# Patient Record
Sex: Female | Born: 1965 | Race: White | Hispanic: Yes | Marital: Married | Smoking: Former smoker
Health system: Southern US, Community
[De-identification: ages and names within clinical notes are randomized; demographics above are authoritative.]

## PROBLEM LIST (undated history)

## (undated) DIAGNOSIS — D649 Anemia, unspecified: Secondary | ICD-10-CM

## (undated) DIAGNOSIS — Z973 Presence of spectacles and contact lenses: Secondary | ICD-10-CM

## (undated) DIAGNOSIS — F419 Anxiety disorder, unspecified: Secondary | ICD-10-CM

## (undated) DIAGNOSIS — Z90710 Acquired absence of both cervix and uterus: Secondary | ICD-10-CM

## (undated) DIAGNOSIS — Z8041 Family history of malignant neoplasm of ovary: Secondary | ICD-10-CM

## (undated) DIAGNOSIS — Z90722 Acquired absence of ovaries, bilateral: Secondary | ICD-10-CM

## (undated) DIAGNOSIS — E039 Hypothyroidism, unspecified: Secondary | ICD-10-CM

## (undated) DIAGNOSIS — N857 Hematometra: Secondary | ICD-10-CM

## (undated) DIAGNOSIS — Z9889 Other specified postprocedural states: Secondary | ICD-10-CM

## (undated) DIAGNOSIS — R102 Pelvic and perineal pain: Secondary | ICD-10-CM

## (undated) HISTORY — DX: Anxiety disorder, unspecified: F41.9

## (undated) HISTORY — DX: Anemia, unspecified: D64.9

## (undated) HISTORY — DX: Hypothyroidism, unspecified: E03.9

## (undated) HISTORY — PX: CERVICAL SPINE SURGERY: SHX589

## (undated) HISTORY — PX: TOTAL VAGINAL HYSTERECTOMY: SHX2548

## (undated) HISTORY — PX: THYROIDECTOMY: SHX17

---

## 1989-10-22 HISTORY — PX: TUBAL LIGATION: SHX77

## 2003-03-08 ENCOUNTER — Ambulatory Visit (HOSPITAL_COMMUNITY): Admission: RE | Admit: 2003-03-08 | Discharge: 2003-03-08 | Payer: Self-pay | Admitting: Orthopaedic Surgery

## 2003-06-25 ENCOUNTER — Ambulatory Visit (HOSPITAL_COMMUNITY): Admission: RE | Admit: 2003-06-25 | Discharge: 2003-06-26 | Payer: Self-pay | Admitting: Orthopaedic Surgery

## 2004-09-25 ENCOUNTER — Encounter: Admission: RE | Admit: 2004-09-25 | Discharge: 2004-09-25 | Payer: Self-pay | Admitting: Internal Medicine

## 2004-10-04 ENCOUNTER — Encounter: Admission: RE | Admit: 2004-10-04 | Discharge: 2004-10-04 | Payer: Self-pay | Admitting: Internal Medicine

## 2004-11-10 ENCOUNTER — Emergency Department (HOSPITAL_COMMUNITY): Admission: EM | Admit: 2004-11-10 | Discharge: 2004-11-10 | Payer: Self-pay | Admitting: Emergency Medicine

## 2005-11-15 ENCOUNTER — Encounter: Admission: RE | Admit: 2005-11-15 | Discharge: 2005-11-15 | Payer: Self-pay | Admitting: Internal Medicine

## 2005-11-22 ENCOUNTER — Encounter: Admission: RE | Admit: 2005-11-22 | Discharge: 2005-11-22 | Payer: Self-pay | Admitting: Internal Medicine

## 2005-11-22 ENCOUNTER — Ambulatory Visit: Admission: RE | Admit: 2005-11-22 | Discharge: 2005-11-22 | Payer: Self-pay | Admitting: Otolaryngology

## 2006-11-19 ENCOUNTER — Encounter: Admission: RE | Admit: 2006-11-19 | Discharge: 2006-11-19 | Payer: Self-pay | Admitting: Internal Medicine

## 2006-12-03 ENCOUNTER — Encounter: Admission: RE | Admit: 2006-12-03 | Discharge: 2006-12-03 | Payer: Self-pay | Admitting: Internal Medicine

## 2007-09-05 ENCOUNTER — Ambulatory Visit (HOSPITAL_COMMUNITY): Admission: RE | Admit: 2007-09-05 | Discharge: 2007-09-05 | Payer: Self-pay | Admitting: Internal Medicine

## 2007-10-23 HISTORY — PX: BACK SURGERY: SHX140

## 2007-11-14 ENCOUNTER — Ambulatory Visit: Payer: Self-pay | Admitting: Internal Medicine

## 2007-11-24 ENCOUNTER — Encounter: Admission: RE | Admit: 2007-11-24 | Discharge: 2007-11-24 | Payer: Self-pay | Admitting: Internal Medicine

## 2008-05-14 ENCOUNTER — Ambulatory Visit (HOSPITAL_COMMUNITY): Admission: RE | Admit: 2008-05-14 | Discharge: 2008-05-15 | Payer: Self-pay | Admitting: Orthopaedic Surgery

## 2008-11-24 ENCOUNTER — Encounter: Admission: RE | Admit: 2008-11-24 | Discharge: 2008-11-24 | Payer: Self-pay | Admitting: Internal Medicine

## 2009-10-04 ENCOUNTER — Ambulatory Visit: Payer: Self-pay | Admitting: Internal Medicine

## 2009-12-19 ENCOUNTER — Encounter: Admission: RE | Admit: 2009-12-19 | Discharge: 2009-12-19 | Payer: Self-pay | Admitting: Internal Medicine

## 2010-01-06 ENCOUNTER — Encounter (INDEPENDENT_AMBULATORY_CARE_PROVIDER_SITE_OTHER): Payer: Self-pay | Admitting: Surgery

## 2010-01-06 ENCOUNTER — Ambulatory Visit (HOSPITAL_COMMUNITY): Admission: RE | Admit: 2010-01-06 | Discharge: 2010-01-07 | Payer: Self-pay | Admitting: Surgery

## 2010-11-13 ENCOUNTER — Other Ambulatory Visit: Payer: Self-pay | Admitting: *Deleted

## 2010-11-13 DIAGNOSIS — Z1239 Encounter for other screening for malignant neoplasm of breast: Secondary | ICD-10-CM

## 2010-12-22 ENCOUNTER — Ambulatory Visit: Payer: Self-pay

## 2010-12-26 ENCOUNTER — Ambulatory Visit: Payer: Self-pay

## 2011-01-04 ENCOUNTER — Ambulatory Visit: Payer: Self-pay

## 2011-01-15 LAB — BASIC METABOLIC PANEL
BUN: 9 mg/dL (ref 6–23)
CO2: 27 mEq/L (ref 19–32)
Calcium: 8.9 mg/dL (ref 8.4–10.5)
Chloride: 107 mEq/L (ref 96–112)
Creatinine, Ser: 0.56 mg/dL (ref 0.4–1.2)
GFR calc Af Amer: >60 mL/min (ref >60)
GFR calc non Af Amer: >60 mL/min (ref >60)
Glucose, Bld: 97 mg/dL (ref 70–99)
Potassium: 3.9 mEq/L (ref 3.5–5.1)
Sodium: 137 mEq/L (ref 135–145)

## 2011-01-15 LAB — URINALYSIS, ROUTINE W REFLEX MICROSCOPIC
Bilirubin Urine: NEGATIVE
Glucose, UA: NEGATIVE mg/dL
Ketones, ur: NEGATIVE mg/dL
Nitrite: NEGATIVE
Protein, ur: NEGATIVE mg/dL
Specific Gravity, Urine: 1.016 (ref 1.005–1.030)
Urobilinogen, UA: 0.2 mg/dL (ref 0.0–1.0)
pH: 6.5 (ref 5.0–8.0)

## 2011-01-15 LAB — URINE MICROSCOPIC-ADD ON

## 2011-01-15 LAB — PROTIME-INR
INR: 0.97 (ref 0.00–1.49)
Prothrombin Time: 12.8 seconds (ref 11.6–15.2)

## 2011-01-15 LAB — DIFFERENTIAL
Basophils Absolute: 0 10*3/uL (ref 0.0–0.1)
Basophils Relative: 1 % (ref 0–1)
Eosinophils Absolute: 0.1 10*3/uL (ref 0.0–0.7)
Eosinophils Relative: 1 % (ref 0–5)
Lymphocytes Relative: 31 % (ref 12–46)
Lymphs Abs: 1.5 10*3/uL (ref 0.7–4.0)
Monocytes Absolute: 0.3 10*3/uL (ref 0.1–1.0)
Monocytes Relative: 7 % (ref 3–12)
Neutro Abs: 3 10*3/uL (ref 1.7–7.7)
Neutrophils Relative %: 61 % (ref 43–77)

## 2011-01-15 LAB — CALCIUM
Calcium: 8.5 mg/dL (ref 8.4–10.5)
Calcium: 8.7 mg/dL (ref 8.4–10.5)

## 2011-01-15 LAB — CBC
HCT: 40.2 % (ref 36.0–46.0)
Hemoglobin: 13.6 g/dL (ref 12.0–15.0)
MCHC: 33.8 g/dL (ref 30.0–36.0)
MCV: 87.7 fL (ref 78.0–100.0)
Platelets: 233 10*3/uL (ref 150–400)
RBC: 4.58 MIL/uL (ref 3.87–5.11)
RDW: 12.8 % (ref 11.5–15.5)
WBC: 4.9 10*3/uL (ref 4.0–10.5)

## 2011-01-15 LAB — PREGNANCY, URINE: Preg Test, Ur: NEGATIVE

## 2011-02-01 IMAGING — CR DG CHEST 2V
2 series · 2 of 2 positions shown · non-contrast
Comparison: None

CLINICAL DATA: Preop for thyroid surgery

CHEST - 2 VIEW

[w chest pa]
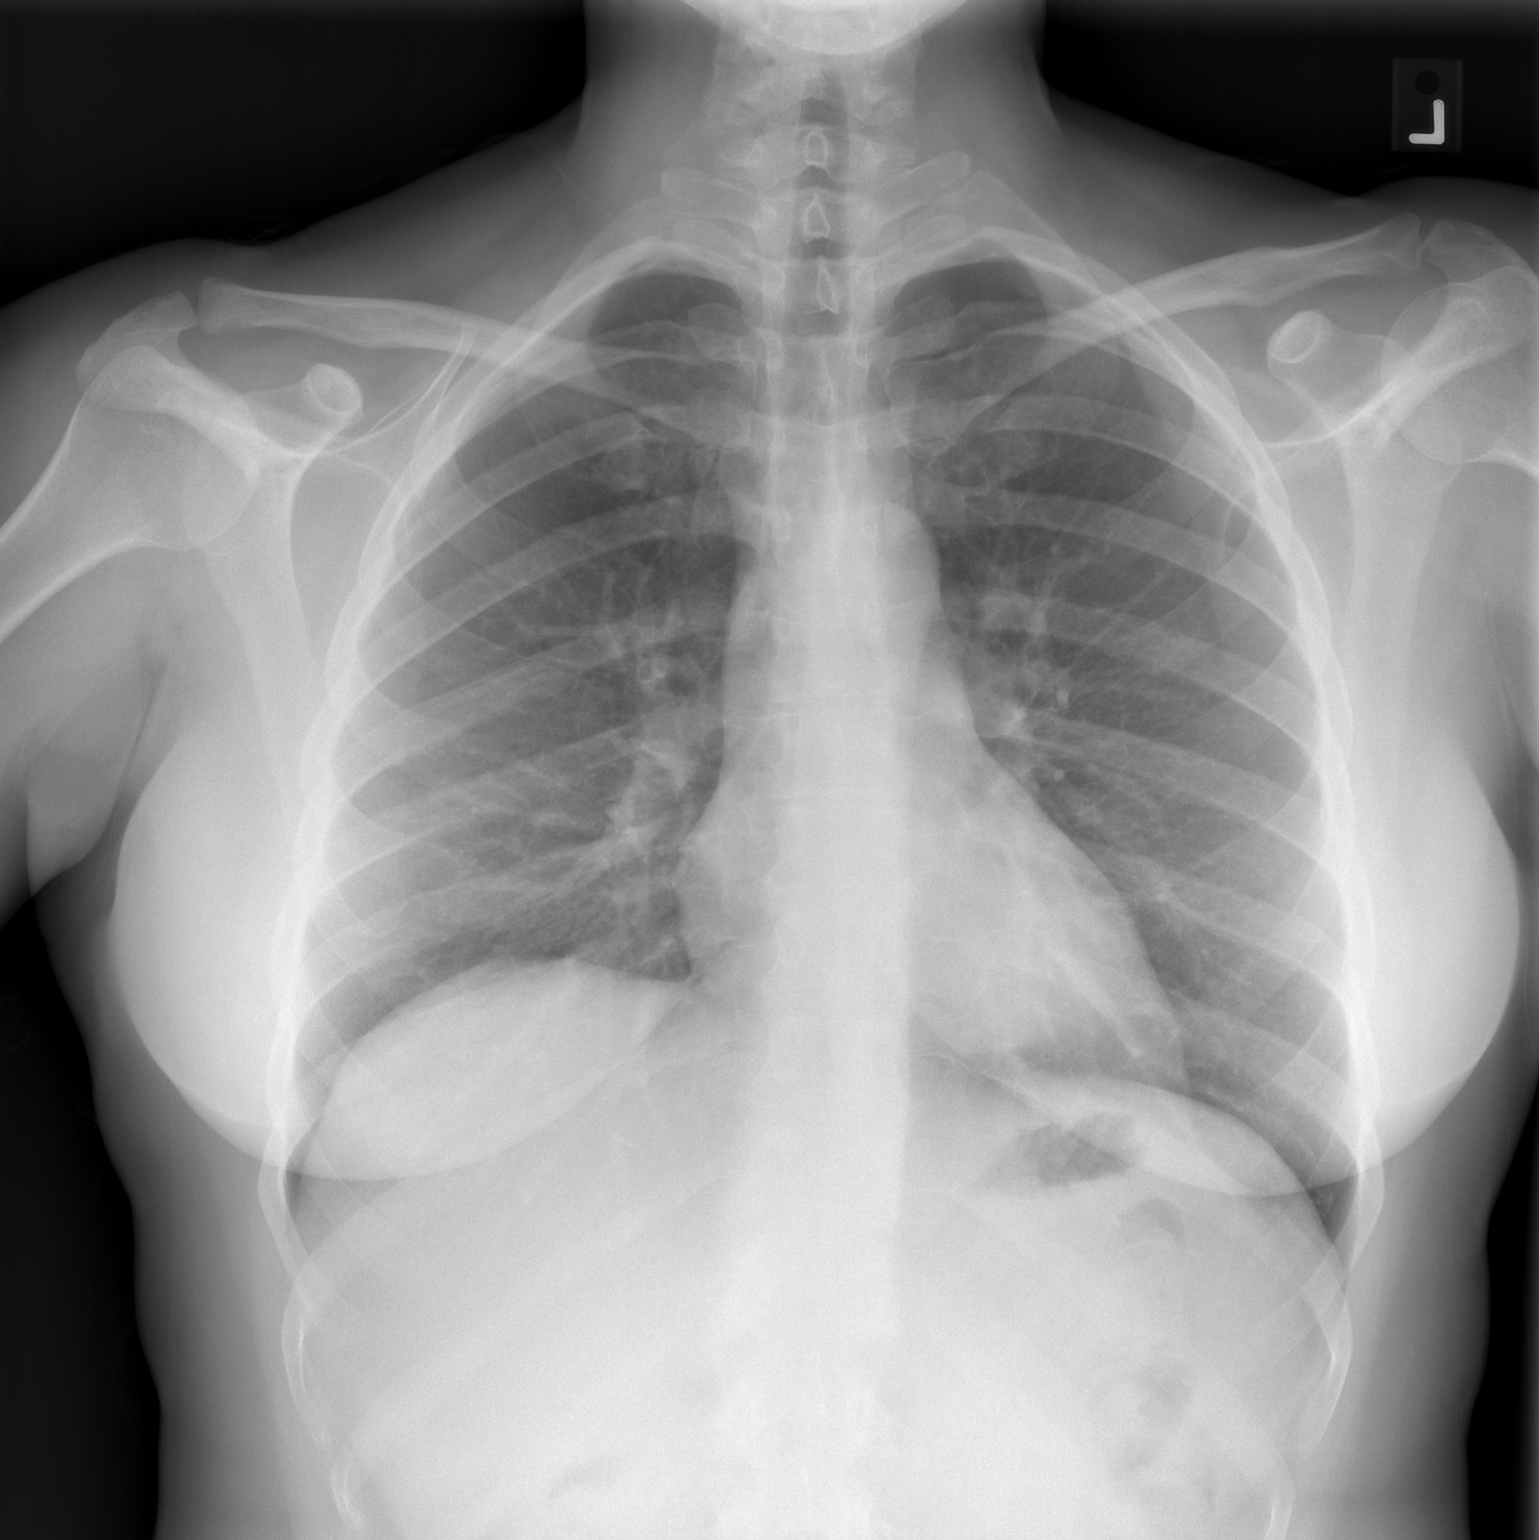

[w chest lat]
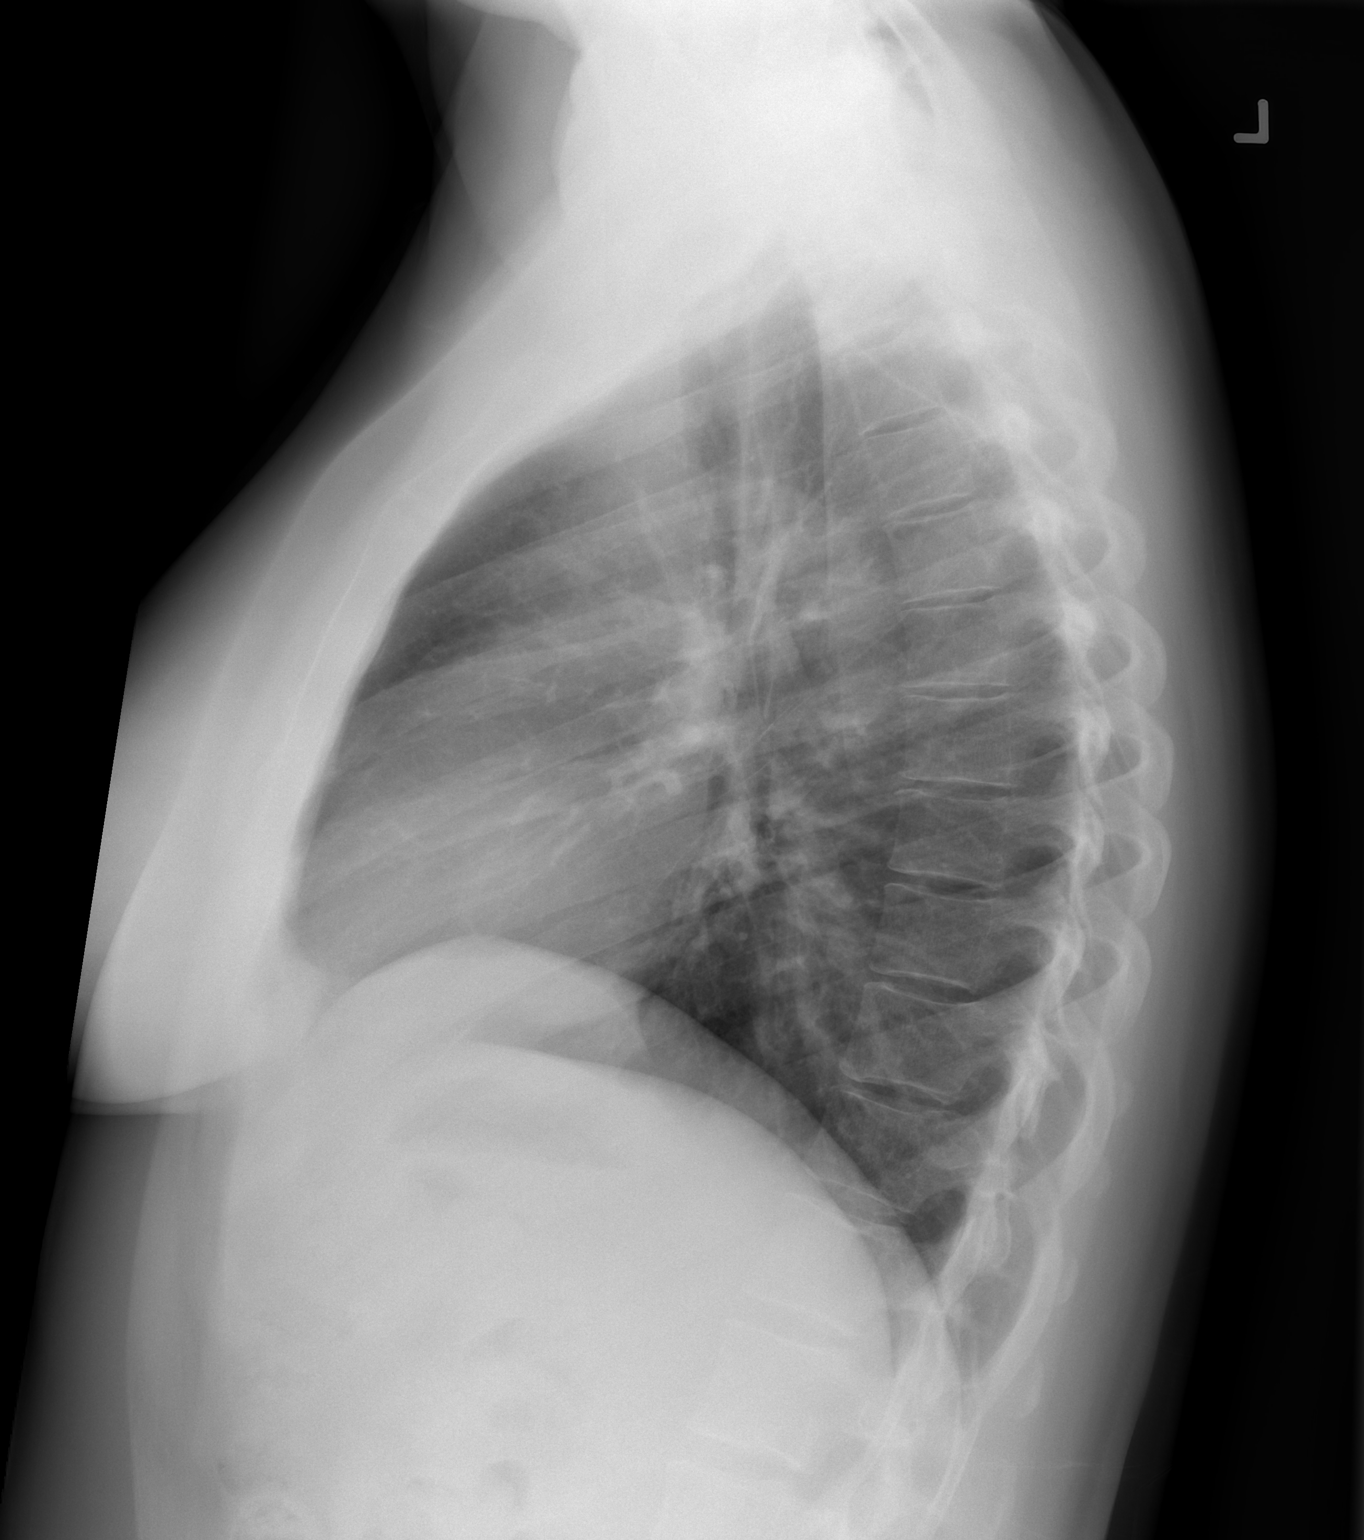

[2 of 2 positions shown; findings below may reference images not displayed]

FINDINGS: Heart and mediastinal contours normal.  Lungs clear.  No
pleural fluid.  Osseous structures and soft tissues unremarkable.
IMPRESSION: No active disease.

## 2011-02-28 ENCOUNTER — Ambulatory Visit
Admission: RE | Admit: 2011-02-28 | Discharge: 2011-02-28 | Disposition: A | Discharge status: Home / Self Care | Payer: BC Managed Care – PPO | Source: Ambulatory Visit | Attending: *Deleted | Admitting: *Deleted

## 2011-02-28 DIAGNOSIS — Z1239 Encounter for other screening for malignant neoplasm of breast: Secondary | ICD-10-CM

## 2011-03-02 ENCOUNTER — Other Ambulatory Visit: Payer: Self-pay | Admitting: Internal Medicine

## 2011-03-02 DIAGNOSIS — Z1239 Encounter for other screening for malignant neoplasm of breast: Secondary | ICD-10-CM

## 2011-03-06 NOTE — Op Note (Signed)
Rebecca Gallegos, Rebecca Gallegos           ACCOUNT NO.:  [id number]   MEDICAL RECORD NO.:  [id number]          PATIENT TYPE:  OIB   LOCATION:  3533                         FACILITY:  [facility]   PHYSICIAN:  Mark C. Ophelia Charter, M.D.    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   POSTOPERATIVE DIAGNOSIS:  Right L4-5 herniated nucleus pulposus with  free fragment.   PROCEDURE:  Right L4-5 microdiskectomy, removal of free fragment.   SURGEON:  Mark C. Ophelia Charter, MD   ANESTHESIA:  General plus 6 mL Marcaine local.   FINDINGS:  L4-5 of free fragment extruded with slight caudal migration.   ESTIMATED BLOOD LOSS:  Minimal.   PROCEDURE:  After the induction of general anesthesia and orotracheal  intubation, the patient was placed prone and kneeling position on the  [place] frame with careful padding and positioning.  Pads were placed  anteriorly over the shoulder as well as over the ulnar nerve region at  the elbow.  Back was prepped with DuraPrep.  Foam was applied.  Area was  scrubbed with towels.  Betadine Vi-Drape applied to the laminectomy  sheet and spinal needle on left L4-5.  Cross-table lateral x-ray  confirmed that the needle was directly over the L4-5 space.   X-ray was left in the room for later confirmation.  Time-out procedure  checklist was completed.  Ancef had been given.  Incision was made 2 mm  right of the midline at L4-5, subperiosteal dissection.  Taylor  retractor placed laterally and laminotomy was performed.  Thick chunks  of ligament were removed and as soon as the disk was gently teased  toward the midline, annular rupture was seen with fragment present.  Passes were made to the disk after annulus was opened with 15 blade,  removing some pieces of ligament.  Nerve was still tight and  microdissection with  operative microscope and ball-tip nerve hook teased  at edges of fragment, which was grasped with micropituitary and teased  out with a black nerve hook, removing moderately large free fragment  HNP, which had caudally migrated and was more eccentric to the right  than the left causing nerve root compression on the right consistent  with weakness.  The patient had initially footdrop, which improved  slightly but then had progressive EHL weakness and preoperative exam  again showed the EHL was able to take minimal resistance.  Once the  large fragment was removed, additional passes were made through the disk  with minimal remaining degenerative material.  The pocket was completely  cleaned out.  Hockey stick could be passed anterior to the dura.  No  areas of compression.  Bone was removed out at full level of the  pedicle.  Foramen was enlarged slightly giving the nerve more room, and  all ligament was removed laterally in the right gutter.  Palpation above  and below showed no areas of remaining compression after irrigation with  saline solution.  Small amount  of FloSeal was placed down to the base  and some epidural veins that were probably bleeding after removal of the  free fragment from the midline and cartilage.  Fascia was closed with 0-  Vicryl.  Operative field was dry, 2-0 Vicryl was placed in the  superficial tissue  after irrigation with saline solution, and 4-0 Vicryl subcuticular skin  closure.  Tincture of benzoin, Steri-Strips, Marcaine infiltration, and  postop dressing was applied.  Patient tolerated the procedure well and  was transferred to recovery room.      Mark C. Ophelia Charter, M.D.  Electronically Signed     MCY/MEDQ  D:  05/14/2008  T:  05/15/2008  Job:  [id number]

## 2011-03-09 NOTE — Op Note (Signed)
Rebecca Gallegos, Rebecca Gallegos                        ACCOUNT NO.:  [id number]   MEDICAL RECORD NO.:  [id number]                   PATIENT TYPE:  OIB   LOCATION:  5016                                 FACILITY:  [facility]   PHYSICIAN:  Mark C. Ophelia Charter, M.D.                 DATE OF BIRTH:  07-17-1966   DATE OF PROCEDURE:  06/25/2003  DATE OF DISCHARGE:  06/26/2003                                 OPERATIVE REPORT   PREOPERATIVE DIAGNOSIS:  Cervical spondylosis, C5-C6.   POSTOPERATIVE DIAGNOSIS:  Cervical spondylosis, C5-C6.   PROCEDURE:  C5-C6 anterior cervical diskectomy and fusion, left iliac crest  bone graft.   SURGEON:  Mark C. Ophelia Charter, M.D.   ASSISTANT:  Zonia Kief, P.A.-C.   ANESTHESIA:  GOT.   ESTIMATED BLOOD LOSS:  200 mL.   DRAINS:  One Hemovac, neck.   DESCRIPTION OF PROCEDURE:  After induction of general anesthesia with  orotracheal intubation, head halter traction application, arms were tucked  to the side with a gel bag underneath the left buttocks and a two-pound sand  bag behind the posterior cervical spine, the neck was prepped with DuraPrep.  The area was draped with towels. Sterile skin marker was used.  Betadine  ViDrape was applied.  Iliac crest was cleared, and Betadine ViDrape was  applied as well.  Sterile mask was applied to the head with thyroid sheath  drape.  Incision was made at the midline extending to the left.  The  platysma was divided in line with the fibers.  The patient had a prominent  thyroid from her history of a thyroid condition with a goiter.  Carotid  tubercle was palpated, and the C5-C6 level was identified.  A 25-gauge short  needle was placed, and a cross-table lateral x-ray confirmed this as the  appropriate level.  Toothed retractors were placed right and left, smooth  blades up and down.  A 15 blade was used to excise the anterior portion of  the annulus.  Cloward retractors were used to the strip the gutters.  Progressive drilling with the  Cloward 12-mm drill was performed back to the  posterior cortex.  Operating  microscope was draped, brought in, and  posterior longitudinal ligament was taken down.  There was abundant trunks  of disk material and with the operating microscope present, there was some  disk material that had migrated back down behind the C6 vertebral body which  was teased up and removed.  These were still retained by the ligament, and  the ligament was completely removed so the dura was directly visualized.  After the gutters were stripped of any remaining pieces of disk, a 14-mm  plug was drilled from the left iliac crest bicortical, trimmed, sized based  on measurements, and inserted so it was countersunk 2 mm.  After irrigation  with saline solution, Hemovac was placed through a separate stab incision  with an in-an-out technique  in line with the incision on the left.  Platysma  was closed with 3-0 and 4-0 Vicryl subcuticular skin  closure.  Tincture of Benzoin and Steri-Strips, 4 x 4's, tape, and a soft  cervical collar.  The iliac crest was closed 0 Vicryl deep, 2-0 Vicryl  subcutaneous tissue, 4-0 subcuticular skin closure, and a postoperative  dressing.  The patient was transferred to the recovery room in stable  condition.                                               Mark C. Ophelia Charter, M.D.    MCY/MEDQ  D:  06/25/2003  T:  06/26/2003  Job:  [id number]

## 2011-07-20 LAB — URINALYSIS, ROUTINE W REFLEX MICROSCOPIC
Hgb urine dipstick: NEGATIVE
Nitrite: NEGATIVE
Protein, ur: NEGATIVE
Specific Gravity, Urine: 1.013
Urobilinogen, UA: 0.2

## 2011-07-20 LAB — PROTIME-INR
INR: 1
Prothrombin Time: 13

## 2011-07-20 LAB — COMPREHENSIVE METABOLIC PANEL
CO2: 28
Calcium: 9.3
Creatinine, Ser: 0.63
GFR calc non Af Amer: >60
Glucose, Bld: 106 — ABNORMAL HIGH
Sodium: 139
Total Protein: 7

## 2011-07-20 LAB — CBC
Hemoglobin: 13.3
MCHC: 34
MCV: 87.9
RBC: 4.46
RDW: 12.8

## 2011-07-20 LAB — APTT: aPTT: 31

## 2011-07-20 LAB — DIFFERENTIAL
Lymphocytes Relative: 36
Lymphs Abs: 1.7
Monocytes Relative: 9
Neutro Abs: 2.5
Neutrophils Relative %: 54

## 2012-03-04 ENCOUNTER — Other Ambulatory Visit: Payer: Self-pay | Admitting: Internal Medicine

## 2012-03-04 DIAGNOSIS — Z1231 Encounter for screening mammogram for malignant neoplasm of breast: Secondary | ICD-10-CM

## 2012-03-21 ENCOUNTER — Ambulatory Visit
Admission: RE | Admit: 2012-03-21 | Discharge: 2012-03-21 | Disposition: A | Discharge status: Home / Self Care | Payer: BC Managed Care – PPO | Source: Ambulatory Visit | Attending: Internal Medicine | Admitting: Internal Medicine

## 2012-03-21 DIAGNOSIS — Z1231 Encounter for screening mammogram for malignant neoplasm of breast: Secondary | ICD-10-CM

## 2012-10-28 ENCOUNTER — Other Ambulatory Visit: Payer: Self-pay | Admitting: Internal Medicine

## 2012-10-28 MED ORDER — ALPRAZOLAM 0.25 MG PO TABS: 0.2500 mg | ORAL_TABLET | Freq: Every day | ORAL | Status: DC | PRN

## 2012-10-28 NOTE — Telephone Encounter (Signed)
rx written for alprazolam .25mg  - one daily prn.  #30 with no refills.  rx printed, signed and on your desk.

## 2012-10-28 NOTE — Telephone Encounter (Signed)
Refill on Alprazolam 0.25mg  tablets qty 30

## 2012-10-28 NOTE — Telephone Encounter (Signed)
Patient requested new script for Alprazolam 25 mg Appointment 11/26/12 Please advise

## 2012-10-29 NOTE — Telephone Encounter (Signed)
Script faxed.

## 2012-11-25 ENCOUNTER — Encounter: Payer: Self-pay | Admitting: Internal Medicine

## 2012-11-25 ENCOUNTER — Encounter: Payer: Self-pay | Admitting: *Deleted

## 2012-11-25 ENCOUNTER — Telehealth: Payer: Self-pay | Admitting: *Deleted

## 2012-11-26 ENCOUNTER — Encounter: Payer: Self-pay | Admitting: Internal Medicine

## 2012-11-26 ENCOUNTER — Ambulatory Visit (INDEPENDENT_AMBULATORY_CARE_PROVIDER_SITE_OTHER): Payer: BC Managed Care – PPO | Admitting: Internal Medicine

## 2012-11-26 VITALS — BP 110/70 | HR 79 | Temp 98.6°F | Ht 66.5 in | Wt 171.8 lb

## 2012-11-26 DIAGNOSIS — F411 Generalized anxiety disorder: Secondary | ICD-10-CM

## 2012-11-26 DIAGNOSIS — F419 Anxiety disorder, unspecified: Secondary | ICD-10-CM

## 2012-11-26 DIAGNOSIS — D509 Iron deficiency anemia, unspecified: Secondary | ICD-10-CM

## 2012-11-26 DIAGNOSIS — E611 Iron deficiency: Secondary | ICD-10-CM

## 2012-11-26 DIAGNOSIS — E039 Hypothyroidism, unspecified: Secondary | ICD-10-CM

## 2012-11-26 DIAGNOSIS — D649 Anemia, unspecified: Secondary | ICD-10-CM

## 2012-11-26 LAB — CBC WITH DIFFERENTIAL/PLATELET
Eosinophils Absolute: 0.1 10*3/uL (ref 0.0–0.7)
Eosinophils Relative: 1.4 % (ref 0.0–5.0)
Lymphocytes Relative: 28.3 % (ref 12.0–46.0)
MCV: 81.8 fl (ref 78.0–100.0)
Monocytes Absolute: 0.4 10*3/uL (ref 0.1–1.0)
Neutrophils Relative %: 61.2 % (ref 43.0–77.0)
Platelets: 260 10*3/uL (ref 150.0–400.0)
WBC: 5.1 10*3/uL (ref 4.5–10.5)

## 2012-11-26 LAB — FERRITIN: Ferritin: 5.4 ng/mL — ABNORMAL LOW (ref 10.0–291.0)

## 2012-11-26 LAB — TSH: TSH: 1.04 u[IU]/mL (ref 0.35–5.50)

## 2012-11-26 MED ORDER — ALPRAZOLAM 0.25 MG PO TABS: 0.2500 mg | ORAL_TABLET | Freq: Every day | ORAL | Status: DC | PRN

## 2012-11-26 NOTE — Telephone Encounter (Signed)
error 

## 2012-11-27 ENCOUNTER — Encounter: Payer: Self-pay | Admitting: Internal Medicine

## 2012-11-27 DIAGNOSIS — F419 Anxiety disorder, unspecified: Secondary | ICD-10-CM | POA: Insufficient documentation

## 2012-11-27 DIAGNOSIS — E039 Hypothyroidism, unspecified: Secondary | ICD-10-CM | POA: Insufficient documentation

## 2012-11-27 DIAGNOSIS — F32A Depression, unspecified: Secondary | ICD-10-CM | POA: Insufficient documentation

## 2012-11-27 DIAGNOSIS — D649 Anemia, unspecified: Secondary | ICD-10-CM | POA: Insufficient documentation

## 2012-11-27 NOTE — Assessment & Plan Note (Signed)
Check cbc and ferritin 

## 2012-11-27 NOTE — Progress Notes (Signed)
  Subjective:    Patient ID: Rebecca Gallegos, female    DOB: November 25, 1965, 47 y.o.   MRN: [id number]  HPI 47 year old female with past history of anxiety, anemia and hypothyroidism who comes in today for a scheduled follow up.  She has been having some low back discomfort.  Flares at times.  Saw Dr Ophelia Charter 11/13.  Was prescribed prednisone.  Did not take.  Had xrays.  Recommended physical therapy.  States (with her schedule) she does not have time for physical therapy.  Taking Alleve bid.  Back improved.  Still flares occasionally.  No radiation of pain.  No numbness or tingling.  Handling stress well.  Stress is better.  Takes an occasional xanax.    Past Medical History  Diagnosis Date  . Hypothyroidism   . Anxiety   . Anemia     Current Outpatient Prescriptions on File Prior to Visit  Medication Sig Dispense Refill  . levothyroxine (SYNTHROID, LEVOTHROID) 150 MCG tablet Take 150 mcg by mouth daily.      Marland Kitchen acetaminophen (TYLENOL) 325 MG tablet Take 325 mg by mouth every 6 (six) hours as needed.      . ergocalciferol (VITAMIN D2) 50000 UNITS capsule Take 50,000 Units by mouth once a week.      Marland Kitchen ibuprofen (ADVIL,MOTRIN) 200 MG tablet Take 200 mg by mouth every 6 (six) hours as needed.        Review of Systems Patient denies any headache, lightheadedness or dizziness.  No sinus or allergy symptoms.  No chest pain, tightness or palpitations.  No increased shortness of breath, cough or congestion.  No nausea or vomiting.  No abdominal pain or cramping.  No bowel change, such as diarrhea, constipation, BRBPR or melana.  No urine change.  Back currently doing better.   Taking Alleve.  Helps.        Objective:   Physical Exam Filed Vitals:   11/26/12 0759  BP: 110/70  Pulse: 79  Temp: 98.6 F (36 C)   47 year old female in no acute distress.   HEENT:  Nares- clear.  Oropharynx - without lesions. NECK:  Supple.  Nontender.  No audible bruit.  HEART:  Appears to be regular. LUNGS:  No  crackles or wheezing audible.  Respirations even and unlabored.  RADIAL PULSE:  Equal bilaterally.     ABDOMEN:  Soft, nontender.  Bowel sounds present and normal.  No audible abdominal bruit.    EXTREMITIES:  No increased edema present.  DP pulses palpable and equal bilaterally.           Assessment & Plan:  BACK PAIN.  Desires no further intervention now.  Saw ortho.  See above.  Controlling with Alleve.  Desires not to be referred to PT at this time.   CARDIOVASCULAR.  Asymptomatic.    HEALTH MAINTENANCE. Physical 05/14/12.  Mammogram 03/21/12 ([facility]) - BiRADS I.

## 2012-11-27 NOTE — Assessment & Plan Note (Signed)
On thyroid replacement.  Check tsh.  

## 2012-11-27 NOTE — Assessment & Plan Note (Signed)
Takes xanax prn.  Follow.  Doing well.

## 2012-11-28 ENCOUNTER — Other Ambulatory Visit: Payer: Self-pay | Admitting: Internal Medicine

## 2012-11-28 DIAGNOSIS — E611 Iron deficiency: Secondary | ICD-10-CM

## 2012-11-28 NOTE — Progress Notes (Signed)
Order placed for follow up labs 

## 2012-12-06 ENCOUNTER — Other Ambulatory Visit: Payer: Self-pay

## 2012-12-17 ENCOUNTER — Encounter: Payer: Self-pay | Admitting: Internal Medicine

## 2012-12-19 ENCOUNTER — Encounter: Payer: Self-pay | Admitting: Internal Medicine

## 2012-12-19 ENCOUNTER — Telehealth: Payer: Self-pay | Admitting: Internal Medicine

## 2012-12-19 NOTE — Telephone Encounter (Signed)
Per Dr. Lorin Picket we can put pt's husband in at 12/25/12 at 11 am. Will try to call pt again

## 2012-12-19 NOTE — Telephone Encounter (Signed)
I received a my chart message from [name] (tina) Mckinzie that her husband Rebecca Gallegos needed an appt next week.  Please notify her that with meetings and schedule I am unable to add him to the schedule.  Can hold for a cancellation or I can try to work him in another week.  Let me know if a problem.

## 2012-12-19 NOTE — Telephone Encounter (Signed)
Left message for pt to call office

## 2013-01-03 ENCOUNTER — Other Ambulatory Visit: Payer: Self-pay | Admitting: Internal Medicine

## 2013-01-05 MED ORDER — ALPRAZOLAM 0.25 MG PO TABS: 0.2500 mg | ORAL_TABLET | Freq: Every day | ORAL | Status: DC | PRN

## 2013-01-05 NOTE — Telephone Encounter (Signed)
Faxed into pharmacy.  

## 2013-01-09 NOTE — Telephone Encounter (Signed)
Already filled

## 2013-02-02 ENCOUNTER — Telehealth: Payer: Self-pay | Admitting: *Deleted

## 2013-02-03 ENCOUNTER — Telehealth: Payer: Self-pay | Admitting: Internal Medicine

## 2013-02-03 MED ORDER — ALPRAZOLAM 0.25 MG PO TABS: 0.2500 mg | ORAL_TABLET | Freq: Every day | ORAL | Status: DC | PRN

## 2013-02-03 NOTE — Telephone Encounter (Signed)
What is the medication?

## 2013-02-03 NOTE — Telephone Encounter (Signed)
Faxed over prescription for Xanax for pt to [facility]

## 2013-02-03 NOTE — Telephone Encounter (Signed)
Refilled xanax x 1

## 2013-02-04 NOTE — Telephone Encounter (Signed)
Please close encounter

## 2013-02-10 ENCOUNTER — Other Ambulatory Visit: Payer: Self-pay

## 2013-02-10 DIAGNOSIS — Z1231 Encounter for screening mammogram for malignant neoplasm of breast: Secondary | ICD-10-CM

## 2013-03-01 ENCOUNTER — Other Ambulatory Visit: Payer: Self-pay | Admitting: Internal Medicine

## 2013-03-03 ENCOUNTER — Other Ambulatory Visit (INDEPENDENT_AMBULATORY_CARE_PROVIDER_SITE_OTHER): Payer: BC Managed Care – PPO

## 2013-03-03 DIAGNOSIS — D509 Iron deficiency anemia, unspecified: Secondary | ICD-10-CM

## 2013-03-03 DIAGNOSIS — E611 Iron deficiency: Secondary | ICD-10-CM

## 2013-03-03 LAB — HEMOGLOBIN: Hemoglobin: 11.4 g/dL — ABNORMAL LOW (ref 12.0–15.0)

## 2013-03-04 ENCOUNTER — Encounter: Payer: Self-pay | Admitting: Internal Medicine

## 2013-03-04 ENCOUNTER — Telehealth: Payer: Self-pay | Admitting: Internal Medicine

## 2013-03-04 NOTE — Telephone Encounter (Signed)
Pt notified of lab results and need for a f/u appt with me.  Please schedule her for a f/u appt with me within the next few weeks.  Thanks.

## 2013-03-05 ENCOUNTER — Encounter: Payer: Self-pay | Admitting: Internal Medicine

## 2013-03-06 NOTE — Telephone Encounter (Signed)
I wanted her to start ferrous sulfate 325mg  q day.  It is otc.

## 2013-03-06 NOTE — Telephone Encounter (Signed)
Pt informed of the otc medication. Also sent it to her through Mychart

## 2013-03-06 NOTE — Telephone Encounter (Signed)
Pt made follow up appointment for 6/10.  Pt stated dr Lorin Picket sent her a mychart message telling her to try a different medication.  Pt could not remember name and wanted to know if this was over the counter or a rx that needed to be called in. [facility] [address]

## 2013-03-24 ENCOUNTER — Ambulatory Visit: Payer: BC Managed Care – PPO

## 2013-03-31 ENCOUNTER — Ambulatory Visit: Payer: BC Managed Care – PPO | Admitting: Internal Medicine

## 2013-04-20 ENCOUNTER — Other Ambulatory Visit: Payer: Self-pay | Admitting: Internal Medicine

## 2013-04-27 ENCOUNTER — Ambulatory Visit: Payer: BC Managed Care – PPO | Admitting: Internal Medicine

## 2013-04-29 ENCOUNTER — Encounter: Payer: Self-pay | Admitting: Internal Medicine

## 2013-04-29 ENCOUNTER — Ambulatory Visit (INDEPENDENT_AMBULATORY_CARE_PROVIDER_SITE_OTHER): Payer: BC Managed Care – PPO | Admitting: Internal Medicine

## 2013-04-29 VITALS — BP 100/70 | HR 67 | Temp 98.6°F | Ht 66.5 in | Wt 175.5 lb

## 2013-04-29 DIAGNOSIS — R350 Frequency of micturition: Secondary | ICD-10-CM

## 2013-04-29 DIAGNOSIS — F419 Anxiety disorder, unspecified: Secondary | ICD-10-CM

## 2013-04-29 DIAGNOSIS — F411 Generalized anxiety disorder: Secondary | ICD-10-CM

## 2013-04-29 DIAGNOSIS — E039 Hypothyroidism, unspecified: Secondary | ICD-10-CM

## 2013-04-29 DIAGNOSIS — D649 Anemia, unspecified: Secondary | ICD-10-CM

## 2013-04-29 DIAGNOSIS — R42 Dizziness and giddiness: Secondary | ICD-10-CM

## 2013-04-30 LAB — COMPREHENSIVE METABOLIC PANEL
ALT: 18 U/L (ref 0–35)
AST: 20 U/L (ref 0–37)
Alkaline Phosphatase: 49 U/L (ref 39–117)
Creatinine, Ser: 0.8 mg/dL (ref 0.4–1.2)
Total Bilirubin: 0.6 mg/dL (ref 0.3–1.2)

## 2013-04-30 LAB — CBC WITH DIFFERENTIAL/PLATELET
Basophils Absolute: 0.1 10*3/uL (ref 0.0–0.1)
HCT: 39.5 % (ref 36.0–46.0)
Lymphs Abs: 1.7 10*3/uL (ref 0.7–4.0)
MCV: 84.8 fl (ref 78.0–100.0)
Monocytes Absolute: 0.4 10*3/uL (ref 0.1–1.0)
Platelets: 262 10*3/uL (ref 150.0–400.0)
RDW: 18.6 % — ABNORMAL HIGH (ref 11.5–14.6)

## 2013-04-30 LAB — TSH: TSH: 8.08 u[IU]/mL — ABNORMAL HIGH (ref 0.35–5.50)

## 2013-04-30 LAB — FERRITIN: Ferritin: 12.5 ng/mL (ref 10.0–291.0)

## 2013-05-01 ENCOUNTER — Telehealth: Payer: Self-pay | Admitting: Internal Medicine

## 2013-05-01 ENCOUNTER — Encounter: Payer: Self-pay | Admitting: Internal Medicine

## 2013-05-01 DIAGNOSIS — E039 Hypothyroidism, unspecified: Secondary | ICD-10-CM

## 2013-05-01 NOTE — Telephone Encounter (Signed)
Pt notified of lab results via my chart.  Needs a f/u tsh in one month.  Please schedule her for a non fasting lab appt in one month and call her with a lab appt date and time.  Thanks.    Dr Lorin Picket

## 2013-05-02 ENCOUNTER — Encounter: Payer: Self-pay | Admitting: Internal Medicine

## 2013-05-02 NOTE — Progress Notes (Signed)
  Subjective:    Patient ID: Rebecca Gallegos, female    DOB: 10-25-1965, 47 y.o.   MRN: [id number]  HPI 47 year old female with past history of anxiety, anemia and hypothyroidism who comes in today for a scheduled follow up.  Handling stress well.  Stress is better.  Has not required xanax lately.  She reports noticing some light headedness.  No vision change.  No headache.  Is not positional.  No known triggers.  No chest pain or tightness. Stays active.  No acid reflux.  No nausea or vomiting.  No bowel change.  Does report some increased urinary frequency and urgency.  No dysuria.  No vaginal problems.  LMP last week.      Past Medical History  Diagnosis Date  . Hypothyroidism   . Anxiety   . Anemia     Current Outpatient Prescriptions on File Prior to Visit  Medication Sig Dispense Refill  . ALPRAZolam (XANAX) 0.25 MG tablet TAKE 1 TABLET BY MOUTH DAILY AS NEEDED  30 tablet  0  . levothyroxine (SYNTHROID, LEVOTHROID) 150 MCG tablet Take 150 mcg by mouth daily.      Marland Kitchen NAPROXEN PO Take by mouth 2 (two) times daily.       No current facility-administered medications on file prior to visit.    Review of Systems Patient denies any headache.  Does report the light headedness as outlined.  No sinus or allergy symptoms.  No chest pain, tightness or palpitations.  No increased shortness of breath, cough or congestion.  No nausea or vomiting.  No acid reflux.  No abdominal pain or cramping.  No bowel change, such as diarrhea, constipation, BRBPR or melana.  Urinary symptoms as outlined.  Back currently doing better.   Taking Alleve.  Helps.        Objective:   Physical Exam  Filed Vitals:   04/29/13 1433  BP: 100/70  Pulse: 67  Temp: 98.6 F (3 C)   47 year old female in no acute distress.   HEENT:  Nares- clear.  Oropharynx - without lesions. NECK:  Supple.  Nontender.  No audible bruit.  HEART:  Appears to be regular. LUNGS:  No crackles or wheezing audible.  Respirations even  and unlabored.  RADIAL PULSE:  Equal bilaterally.     ABDOMEN:  Soft, nontender.  Bowel sounds present and normal.  No audible abdominal bruit.    EXTREMITIES:  No increased edema present.  DP pulses palpable and equal bilaterally.           Assessment & Plan:  LIGHT HEADEDNESS.  Symptoms as outlined.  No known triggers.  Stay hydrated.  Check cbc, metabolic panel and thyroid function.  Further w/up pending.  May need ENT or neurology referral.    URINARY FREQUENCY/URGENCY.  Check urinalysis and culture to confirm no infection.    BACK PAIN.  Saw ortho.  Controlling with Alleve.   CARDIOVASCULAR.  Asymptomatic.    HEALTH MAINTENANCE. Physical 05/14/12.  Mammogram 03/21/12 ([facility]) - BiRADS I.

## 2013-05-02 NOTE — Assessment & Plan Note (Signed)
Follow.  Doing well.  Not requiring xanax now.    

## 2013-05-02 NOTE — Assessment & Plan Note (Signed)
On thyroid replacement.  Check tsh.  She reports she has been taking her thyroid medication with her iron.  Instructed her to take her synthroid by itself in the am.  Space out the iron.

## 2013-05-02 NOTE — Assessment & Plan Note (Signed)
Check cbc and ferritin 

## 2013-05-04 NOTE — Telephone Encounter (Signed)
Lab appointment 8/14 sent my chart message to let pt know

## 2013-05-18 ENCOUNTER — Ambulatory Visit (INDEPENDENT_AMBULATORY_CARE_PROVIDER_SITE_OTHER): Payer: BC Managed Care – PPO | Admitting: Internal Medicine

## 2013-05-18 ENCOUNTER — Encounter: Payer: Self-pay | Admitting: Internal Medicine

## 2013-05-18 ENCOUNTER — Other Ambulatory Visit (HOSPITAL_COMMUNITY)
Admission: RE | Admit: 2013-05-18 | Discharge: 2013-05-18 | Disposition: A | Discharge status: Home / Self Care | Payer: BC Managed Care – PPO | Source: Ambulatory Visit | Attending: Internal Medicine | Admitting: Internal Medicine

## 2013-05-18 VITALS — BP 120/80 | HR 67 | Temp 98.1°F | Ht 66.75 in | Wt 176.5 lb

## 2013-05-18 DIAGNOSIS — E039 Hypothyroidism, unspecified: Secondary | ICD-10-CM

## 2013-05-18 DIAGNOSIS — Z1151 Encounter for screening for human papillomavirus (HPV): Secondary | ICD-10-CM | POA: Insufficient documentation

## 2013-05-18 DIAGNOSIS — Z01419 Encounter for gynecological examination (general) (routine) without abnormal findings: Secondary | ICD-10-CM | POA: Insufficient documentation

## 2013-05-18 DIAGNOSIS — Z124 Encounter for screening for malignant neoplasm of cervix: Secondary | ICD-10-CM

## 2013-05-18 DIAGNOSIS — F411 Generalized anxiety disorder: Secondary | ICD-10-CM

## 2013-05-18 DIAGNOSIS — F419 Anxiety disorder, unspecified: Secondary | ICD-10-CM

## 2013-05-18 DIAGNOSIS — D649 Anemia, unspecified: Secondary | ICD-10-CM

## 2013-05-18 LAB — HM PAP SMEAR

## 2013-05-18 NOTE — Progress Notes (Signed)
  Subjective:    Patient ID: Rebecca Gallegos, female    DOB: Feb 28, 1966, 47 y.o.   MRN: [id number]  HPI 47 year old female with past history of anxiety, anemia and hypothyroidism who comes in today to follow up on these issues as well as for a complete physical exam.   Handling stress well.  Stress is better.  Has not required xanax lately.   She is in the process now of buying a new house.  Some stress with this.  No chest pain or tightness. Stays active.  No acid reflux.  No nausea or vomiting.  No bowel change.   No dysuria.  No vaginal problems.  LMP three weeks ago.  No light headedness since her last visit here.       Past Medical History  Diagnosis Date  . Hypothyroidism   . Anxiety   . Anemia     Current Outpatient Prescriptions on File Prior to Visit  Medication Sig Dispense Refill  . ALPRAZolam (XANAX) 0.25 MG tablet TAKE 1 TABLET BY MOUTH DAILY AS NEEDED  30 tablet  0  . ferrous sulfate 325 (65 FE) MG tablet Take 325 mg by mouth daily.      Marland Kitchen levothyroxine (SYNTHROID, LEVOTHROID) 150 MCG tablet Take 150 mcg by mouth daily.      Marland Kitchen NAPROXEN PO Take by mouth 2 (two) times daily.       No current facility-administered medications on file prior to visit.    Review of Systems Patient denies any headache.  Light headedness has resolved.  No sinus or allergy symptoms.  No chest pain, tightness or palpitations.  No increased shortness of breath, cough or congestion.  No nausea or vomiting.  No acid reflux.  No abdominal pain or cramping.  No bowel change, such as diarrhea, constipation, BRBPR or melana.  Back currently doing better.   Taking Alleve.  Helps.   Handling stress relatively well.  Not requiring xanax.      Objective:   Physical Exam  Filed Vitals:   05/18/13 0834  BP: 120/80  Pulse: 67  Temp: 98.1 F (36.7 C)   Pulse recheck 17  47 year old female in no acute distress.   HEENT:  Nares- clear.  Oropharynx - without lesions. NECK:  Supple.  Nontender.  No  audible bruit.  HEART:  Appears to be regular. LUNGS:  No crackles or wheezing audible.  Respirations even and unlabored.  RADIAL PULSE:  Equal bilaterally.    BREASTS:  No nipple discharge or nipple retraction present.  Could not appreciate any distinct nodules or axillary adenopathy.  ABDOMEN:  Soft, nontender.  Bowel sounds present and normal.  No audible abdominal bruit.  GU:  Normal external genitalia.  Vaginal vault without lesions.  Cervix identified.  Pap performed. Could not appreciate any adnexal masses or tenderness.   RECTAL:  Heme negative.   EXTREMITIES:  No increased edema present.  DP pulses palpable and equal bilaterally.           Assessment & Plan:  LIGHT HEADEDNESS.  Has resolved.   BACK PAIN.  Saw ortho.  Controlling with Alleve.   CARDIOVASCULAR.  Asymptomatic.    HEALTH MAINTENANCE. Physical today.  Mammogram 03/21/12 ([facility]) - BiRADS I.  Scheduled for a follow up mammogram to be down within the next two weeks.

## 2013-05-18 NOTE — Assessment & Plan Note (Signed)
Follow.  Doing well.  Not requiring xanax now.    

## 2013-05-18 NOTE — Assessment & Plan Note (Signed)
On thyroid replacement.  Was taking with her iron.  Last tsh slightly elevated.  Recheck tsh as scheduled.

## 2013-05-18 NOTE — Assessment & Plan Note (Signed)
Continue iron supplements.  Follow cbc.

## 2013-05-19 ENCOUNTER — Encounter: Payer: Self-pay | Admitting: Internal Medicine

## 2013-05-22 NOTE — Telephone Encounter (Signed)
Mailed unread message to patient.  

## 2013-06-04 ENCOUNTER — Other Ambulatory Visit: Payer: BC Managed Care – PPO

## 2013-06-15 ENCOUNTER — Other Ambulatory Visit (INDEPENDENT_AMBULATORY_CARE_PROVIDER_SITE_OTHER): Payer: BC Managed Care – PPO

## 2013-06-15 DIAGNOSIS — E039 Hypothyroidism, unspecified: Secondary | ICD-10-CM

## 2013-06-16 LAB — TSH: TSH: 5.03 u[IU]/mL (ref 0.35–5.50)

## 2013-06-17 ENCOUNTER — Telehealth: Payer: Self-pay | Admitting: Internal Medicine

## 2013-06-17 ENCOUNTER — Encounter: Payer: Self-pay | Admitting: Internal Medicine

## 2013-06-17 DIAGNOSIS — E039 Hypothyroidism, unspecified: Secondary | ICD-10-CM

## 2013-06-17 NOTE — Telephone Encounter (Signed)
Pt notified of lab results via my chart and the need for a f/u lab in 6 weeks.  Please schedule her for a non fasting lab appt in 6 weeks.  Thanks.

## 2013-06-18 NOTE — Telephone Encounter (Signed)
Left message for pt to call the office.

## 2013-06-18 NOTE — Telephone Encounter (Signed)
Appointment 10/6 pt aware

## 2013-06-19 ENCOUNTER — Ambulatory Visit
Admission: RE | Admit: 2013-06-19 | Discharge: 2013-06-19 | Disposition: A | Discharge status: Home / Self Care | Payer: BC Managed Care – PPO | Source: Ambulatory Visit

## 2013-06-19 ENCOUNTER — Telehealth: Payer: Self-pay | Admitting: *Deleted

## 2013-06-19 DIAGNOSIS — Z1231 Encounter for screening mammogram for malignant neoplasm of breast: Secondary | ICD-10-CM

## 2013-06-19 LAB — HM MAMMOGRAPHY

## 2013-06-19 NOTE — Telephone Encounter (Signed)
Pt would like a return call. She reports that her cycle started 3 weeks late & it has now been ongoing x 9 days (usually last about 3 days or so)

## 2013-06-19 NOTE — Telephone Encounter (Signed)
Pt notified & nothing is different (no new meds or abx)

## 2013-06-19 NOTE — Telephone Encounter (Signed)
Would keep menstrual diary.  Sometimes periods will fluctuate and return to normal.  Is she doing anything different?  Any abx or new medications?.  If persistent, I can work her in for evaluation.

## 2013-07-06 ENCOUNTER — Telehealth: Payer: Self-pay | Admitting: Internal Medicine

## 2013-07-06 NOTE — Telephone Encounter (Addendum)
Patient called stating she has now been having her period for 4 weeks. She has been traveling a lot and this is in town this week. Your schedule is full, I offered her to see Raquel but she declined. Is there any where we can fit her in? Please advise

## 2013-07-06 NOTE — Telephone Encounter (Signed)
The patient has an apt tomorrow 9/16 @ 1015am.

## 2013-07-06 NOTE — Telephone Encounter (Signed)
Pt coming at 11:45 instead of 10:15.  Please change on schedule if need to.

## 2013-07-06 NOTE — Telephone Encounter (Signed)
Pt scheduled for appt.

## 2013-07-06 NOTE — Telephone Encounter (Signed)
Patient will be here at 11:45

## 2013-07-06 NOTE — Telephone Encounter (Signed)
Please have pt come in at 11:45 tomorrow.

## 2013-07-07 ENCOUNTER — Encounter: Payer: Self-pay | Admitting: Internal Medicine

## 2013-07-07 ENCOUNTER — Ambulatory Visit (INDEPENDENT_AMBULATORY_CARE_PROVIDER_SITE_OTHER): Payer: BC Managed Care – PPO | Admitting: Internal Medicine

## 2013-07-07 VITALS — BP 120/72 | HR 60 | Temp 98.3°F | Resp 12 | Ht 66.75 in | Wt 176.0 lb

## 2013-07-07 DIAGNOSIS — E039 Hypothyroidism, unspecified: Secondary | ICD-10-CM

## 2013-07-07 DIAGNOSIS — D649 Anemia, unspecified: Secondary | ICD-10-CM

## 2013-07-07 DIAGNOSIS — N926 Irregular menstruation, unspecified: Secondary | ICD-10-CM

## 2013-07-07 DIAGNOSIS — N939 Abnormal uterine and vaginal bleeding, unspecified: Secondary | ICD-10-CM

## 2013-07-07 NOTE — Telephone Encounter (Signed)
Appointment changed

## 2013-07-08 ENCOUNTER — Ambulatory Visit: Payer: Self-pay | Admitting: Internal Medicine

## 2013-07-10 ENCOUNTER — Telehealth: Payer: Self-pay | Admitting: Internal Medicine

## 2013-07-10 ENCOUNTER — Encounter: Payer: Self-pay | Admitting: Internal Medicine

## 2013-07-10 DIAGNOSIS — N926 Irregular menstruation, unspecified: Secondary | ICD-10-CM | POA: Insufficient documentation

## 2013-07-10 NOTE — Telephone Encounter (Signed)
Order placed for referral to gyn.  

## 2013-07-10 NOTE — Telephone Encounter (Signed)
Patient wants to discuss the results of he Ultra sound with Dr. Lorin Picket.

## 2013-07-10 NOTE — Progress Notes (Signed)
  Subjective:    Patient ID: Barbra Sarks, female    DOB: 09/19/1966, 47 y.o.   MRN: [id number]  HPI 47 year old female with past history of anxiety, anemia and hypothyroidism who comes in today as a work in with concerns regarding abnormal menstrual bleeding.  States that she has been having vaginal bleeding now for 4 weeks.  Usually her period lasts three days.  Four weeks ago, started her period and has not stopped bleeding since.  Intermittently will be a little lighter, and then will pass clots.  More than just spotting.  No change in meds or activity.  No light headedness or dizziness.        Past Medical History  Diagnosis Date  . Hypothyroidism   . Anxiety   . Anemia     Current Outpatient Prescriptions on File Prior to Visit  Medication Sig Dispense Refill  . calcium-vitamin D (OSCAL WITH D) 250-125 MG-UNIT per tablet Take 1 tablet by mouth daily.      . ferrous sulfate 325 (65 FE) MG tablet Take 325 mg by mouth daily.      Marland Kitchen levothyroxine (SYNTHROID, LEVOTHROID) 150 MCG tablet Take 150 mcg by mouth daily.      Marland Kitchen NAPROXEN PO Take by mouth 2 (two) times daily.       No current facility-administered medications on file prior to visit.    Review of Systems Patient denies any headache.  Light headedness has resolved.  No sinus or allergy symptoms.  No chest pain, tightness or palpitations.  No increased shortness of breath, cough or congestion.  No nausea or vomiting.  No acid reflux.  No bowel change.  Bleeding as outlined.       Objective:   Physical Exam  Filed Vitals:   07/07/13 1151  BP: 120/72  Pulse: 60  Temp: 98.3 F (36.8 C)  Resp: 16   47 year old female in no acute distress.   HEENT:  Nares- clear.  Oropharynx - without lesions. NECK:  Supple.  Nontender.  No audible bruit.  HEART:  Appears to be regular. LUNGS:  No crackles or wheezing audible.  Respirations even and unlabored.  RADIAL PULSE:  Equal bilaterally.  ABDOMEN:  Soft, nontender.  Bowel  sounds present and normal.  No audible abdominal bruit.  GU:  Normal external genitalia.  Vaginal vault without lesions.  Blood in the vaginal vault.  Cervix identified.  No polyp and no lesions found.  Could not appreciate any adnexal masses or tenderness.         Assessment & Plan:  BACK PAIN.  Saw ortho.  Controlling with Alleve.   CARDIOVASCULAR.  Asymptomatic.    HEALTH MAINTENANCE. Physical last visit.  Mammogram 06/19/13 ([facility]) - BiRADS I.

## 2013-07-10 NOTE — Telephone Encounter (Signed)
Pt received her results from you this morning through [facility]

## 2013-07-10 NOTE — Telephone Encounter (Signed)
Pt notified of pelvic ultrasound results via my chart.  Recommend referral to gyn.

## 2013-07-10 NOTE — Assessment & Plan Note (Signed)
Continue iron supplements.  Follow cbc.       

## 2013-07-10 NOTE — Assessment & Plan Note (Signed)
Persistent bleeding.  Exam as outlined.  Check pelvic ultrasound.

## 2013-07-10 NOTE — Assessment & Plan Note (Signed)
Due follow up tsh check in a couple of weeks.

## 2013-07-16 ENCOUNTER — Other Ambulatory Visit: Payer: Self-pay | Admitting: Internal Medicine

## 2013-07-27 ENCOUNTER — Other Ambulatory Visit (INDEPENDENT_AMBULATORY_CARE_PROVIDER_SITE_OTHER): Payer: BC Managed Care – PPO

## 2013-07-27 ENCOUNTER — Encounter: Payer: Self-pay | Admitting: Internal Medicine

## 2013-07-27 DIAGNOSIS — E039 Hypothyroidism, unspecified: Secondary | ICD-10-CM

## 2013-07-27 LAB — TSH: TSH: 3.72 u[IU]/mL (ref 0.35–5.50)

## 2013-08-27 ENCOUNTER — Other Ambulatory Visit: Payer: Self-pay

## 2013-10-26 ENCOUNTER — Other Ambulatory Visit: Payer: Self-pay | Admitting: Internal Medicine

## 2013-10-27 NOTE — Telephone Encounter (Signed)
Ok refill xanax #30 with no refills.

## 2013-10-27 NOTE — Telephone Encounter (Signed)
Okay to refill? 

## 2013-11-20 ENCOUNTER — Encounter: Payer: Self-pay | Admitting: Internal Medicine

## 2013-11-20 ENCOUNTER — Ambulatory Visit (INDEPENDENT_AMBULATORY_CARE_PROVIDER_SITE_OTHER): Payer: BC Managed Care – PPO | Admitting: Internal Medicine

## 2013-11-20 ENCOUNTER — Other Ambulatory Visit (INDEPENDENT_AMBULATORY_CARE_PROVIDER_SITE_OTHER): Payer: BC Managed Care – PPO

## 2013-11-20 VITALS — BP 130/84 | HR 65 | Temp 98.3°F | Ht 66.75 in | Wt 178.0 lb

## 2013-11-20 DIAGNOSIS — D649 Anemia, unspecified: Secondary | ICD-10-CM

## 2013-11-20 DIAGNOSIS — E039 Hypothyroidism, unspecified: Secondary | ICD-10-CM

## 2013-11-20 DIAGNOSIS — F411 Generalized anxiety disorder: Secondary | ICD-10-CM

## 2013-11-20 DIAGNOSIS — R05 Cough: Secondary | ICD-10-CM

## 2013-11-20 DIAGNOSIS — R5381 Other malaise: Secondary | ICD-10-CM

## 2013-11-20 DIAGNOSIS — R5383 Other fatigue: Secondary | ICD-10-CM

## 2013-11-20 DIAGNOSIS — N926 Irregular menstruation, unspecified: Secondary | ICD-10-CM

## 2013-11-20 DIAGNOSIS — F419 Anxiety disorder, unspecified: Secondary | ICD-10-CM

## 2013-11-20 DIAGNOSIS — R059 Cough, unspecified: Secondary | ICD-10-CM

## 2013-11-20 LAB — CBC WITH DIFFERENTIAL/PLATELET
BASOS PCT: 0.5 % (ref 0.0–3.0)
Basophils Absolute: 0 10*3/uL (ref 0.0–0.1)
EOS PCT: 3 % (ref 0.0–5.0)
Eosinophils Absolute: 0.1 10*3/uL (ref 0.0–0.7)
HCT: 37.9 % (ref 36.0–46.0)
Hemoglobin: 12.4 g/dL (ref 12.0–15.0)
LYMPHS PCT: 30.4 % (ref 12.0–46.0)
Lymphs Abs: 1.3 10*3/uL (ref 0.7–4.0)
MCHC: 32.8 g/dL (ref 30.0–36.0)
MCV: 84.7 fl (ref 78.0–100.0)
Monocytes Absolute: 0.5 10*3/uL (ref 0.1–1.0)
Monocytes Relative: 10.6 % (ref 3.0–12.0)
NEUTROS PCT: 55.5 % (ref 43.0–77.0)
Neutro Abs: 2.5 10*3/uL (ref 1.4–7.7)
Platelets: 231 10*3/uL (ref 150.0–400.0)
RBC: 4.47 Mil/uL (ref 3.87–5.11)
RDW: 15.4 % — ABNORMAL HIGH (ref 11.5–14.6)
WBC: 4.4 10*3/uL — ABNORMAL LOW (ref 4.5–10.5)

## 2013-11-20 LAB — COMPREHENSIVE METABOLIC PANEL
ALBUMIN: 4 g/dL (ref 3.5–5.2)
ALK PHOS: 41 U/L (ref 39–117)
ALT: 19 U/L (ref 0–35)
AST: 16 U/L (ref 0–37)
BUN: 13 mg/dL (ref 6–23)
CO2: 27 mEq/L (ref 19–32)
Calcium: 8.9 mg/dL (ref 8.4–10.5)
Chloride: 106 mEq/L (ref 96–112)
Creatinine, Ser: 0.6 mg/dL (ref 0.4–1.2)
GFR: 119.9 mL/min (ref >60.00)
Glucose, Bld: 103 mg/dL — ABNORMAL HIGH (ref 70–99)
POTASSIUM: 4.4 meq/L (ref 3.5–5.1)
SODIUM: 138 meq/L (ref 135–145)
TOTAL PROTEIN: 7.3 g/dL (ref 6.0–8.3)
Total Bilirubin: 0.8 mg/dL (ref 0.3–1.2)

## 2013-11-20 LAB — TSH: TSH: 1.21 u[IU]/mL (ref 0.35–5.50)

## 2013-11-20 LAB — IBC PANEL
IRON: 60 ug/dL (ref 42–145)
Saturation Ratios: 16.2 % — ABNORMAL LOW (ref 20.0–50.0)
TRANSFERRIN: 265.2 mg/dL (ref 212.0–360.0)

## 2013-11-20 LAB — FERRITIN: Ferritin: 17.7 ng/mL (ref 10.0–291.0)

## 2013-11-20 MED ORDER — BENZONATATE 100 MG PO CAPS: 100.0000 mg | ORAL_CAPSULE | Freq: Three times a day (TID) | ORAL | Status: DC | PRN

## 2013-11-20 NOTE — Patient Instructions (Signed)
Robitussin DM

## 2013-11-20 NOTE — Progress Notes (Signed)
Pre-visit discussion using our clinic review tool. No additional management support is needed unless otherwise documented below in the visit note.  

## 2013-11-22 ENCOUNTER — Encounter: Payer: Self-pay | Admitting: Internal Medicine

## 2013-11-22 DIAGNOSIS — R053 Chronic cough: Secondary | ICD-10-CM | POA: Insufficient documentation

## 2013-11-22 DIAGNOSIS — R05 Cough: Secondary | ICD-10-CM | POA: Insufficient documentation

## 2013-11-22 NOTE — Progress Notes (Signed)
  Subjective:    Patient ID: Rebecca Gallegos, female    DOB: [birth year], 48 y.o.   MRN: [id number]  HPI 48 year old female with past history of anxiety, anemia and hypothyroidism who comes in today for a scheduled follow up.  Was previously having increased vaginal bleeding.  Persistent.  Was evaluated by gyn.  Had repeat ultrasound.  Was given hormones.  Bleeding stopped temporarily.  Restarted.  Persistent bleeding now.  Referred for ablation.  Just had f/u yesterday.  Planning for saline infusion (ultrasound) and biopsy and then decide best treatment options.  Is taking her iron.  No light headedness or dizziness.  Breathing stable.  Previously had a head cold.  Better now.  Some coughing with talking.  No acid reflux.       Past Medical History  Diagnosis Date  . Hypothyroidism   . Anxiety   . Anemia     Current Outpatient Prescriptions on File Prior to Visit  Medication Sig Dispense Refill  . ALPRAZolam (XANAX) 0.25 MG tablet TAKE 1 TABLET BY MOUTH DAILY AS NEEDED  30 tablet  0  . calcium-vitamin D (OSCAL WITH D) 250-125 MG-UNIT per tablet Take 1 tablet by mouth daily.      . ferrous sulfate 325 (65 FE) MG tablet Take 325 mg by mouth daily.      Marland Kitchen levothyroxine (SYNTHROID, LEVOTHROID) 150 MCG tablet TAKE 1 TABLET BY MOUTH EVERY DAY  90 tablet  1  . NAPROXEN PO Take by mouth 2 (two) times daily.       No current facility-administered medications on file prior to visit.    Review of Systems Patient denies any headache.  No light headedness or dizziness.  No sinus or allergy symptoms.  No chest pain, tightness or palpitations.  No increased shortness of breath.  Some cough with talking.   No nausea or vomiting.  No acid reflux.  No bowel change.  Bleeding as outlined.  Persistent.  Undergoing gyn evaluation.       Objective:   Physical Exam  Filed Vitals:   11/20/13 0800  BP: 130/84  Pulse: 65  Temp: 98.3 F (30.33 C)   48 year old female in no acute distress.   HEENT:   Nares- clear.  Oropharynx - without lesions. NECK:  Supple.  Nontender.  No audible bruit.  HEART:  Appears to be regular. LUNGS:  No crackles or wheezing audible.  Respirations even and unlabored.  RADIAL PULSE:  Equal bilaterally.  ABDOMEN:  Soft, nontender.  Bowel sounds present and normal.  No audible abdominal bruit.  EXTREMITIES:  No increased edema present.  DP pulses palpable and equal bilaterally.            Assessment & Plan:  BACK PAIN.  Saw ortho.  Controlling with Alleve.   CARDIOVASCULAR.  Asymptomatic.    HEALTH MAINTENANCE. Physical 05/18/13.   Mammogram 06/19/13 ([facility]) - BiRADS I.

## 2013-11-22 NOTE — Assessment & Plan Note (Signed)
Persistent bleeding.  Has been evaluated by gyn.  Planning for saline infused ultrasound and endometrial biopsy.  Further w/up pending results.  Continue f/u with gyn.

## 2013-11-22 NOTE — Assessment & Plan Note (Signed)
Follow.  Doing well.  Not requiring xanax now.

## 2013-11-22 NOTE — Assessment & Plan Note (Signed)
On thyroid replacement.  Follow tsh.  

## 2013-11-22 NOTE — Assessment & Plan Note (Signed)
Continue iron supplements.  Follow cbc.  Recheck today given the increased bleeding.

## 2013-11-22 NOTE — Assessment & Plan Note (Signed)
Symptoms better.  Some residual cough.  Robitussin as directed.  Follow.  Notify me if persistent.

## 2013-11-23 ENCOUNTER — Encounter: Payer: Self-pay | Admitting: Internal Medicine

## 2013-12-15 ENCOUNTER — Other Ambulatory Visit: Payer: Self-pay | Admitting: Internal Medicine

## 2013-12-15 ENCOUNTER — Telehealth: Payer: Self-pay | Admitting: Genetic Counselor

## 2013-12-15 NOTE — Telephone Encounter (Signed)
S/W PATIENT AND SCHEDULED GENETIC COUSELING APPT FOR 05/11 @ 2 Shari Prows POWELL.  REFERRING DR. 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

## 2013-12-16 NOTE — Telephone Encounter (Signed)
Refilled xanax #30 with no refill.

## 2013-12-16 NOTE — Telephone Encounter (Signed)
Rx faxed to pharmacy  

## 2013-12-16 NOTE — Telephone Encounter (Signed)
Last visit 11/20/13, refill?

## 2014-01-10 ENCOUNTER — Other Ambulatory Visit: Payer: Self-pay | Admitting: Internal Medicine

## 2014-01-11 NOTE — Telephone Encounter (Signed)
Okay to refill Xanax? Last refilled on 12/16/13 #30

## 2014-01-11 NOTE — Telephone Encounter (Signed)
Refilled alprazolam #30 with no refills.

## 2014-02-01 ENCOUNTER — Telehealth: Payer: Self-pay | Admitting: *Deleted

## 2014-02-01 NOTE — Telephone Encounter (Signed)
Called pt & informed her that Santiago Glad is no longer with [place] and I needed to reschedule her.  Confirmed 03/05/14 genetic appt w/ pt.

## 2014-02-10 ENCOUNTER — Other Ambulatory Visit: Payer: Self-pay | Admitting: Internal Medicine

## 2014-02-11 NOTE — Telephone Encounter (Signed)
Electronic Rx request, Please advise. 

## 2014-02-11 NOTE — Telephone Encounter (Signed)
Refilled xanax #30 with no refills.

## 2014-03-01 ENCOUNTER — Other Ambulatory Visit: Payer: BC Managed Care – PPO

## 2014-03-01 ENCOUNTER — Encounter: Payer: BC Managed Care – PPO | Admitting: Genetic Counselor

## 2014-03-05 ENCOUNTER — Other Ambulatory Visit: Payer: BC Managed Care – PPO

## 2014-03-05 ENCOUNTER — Ambulatory Visit (HOSPITAL_BASED_OUTPATIENT_CLINIC_OR_DEPARTMENT_OTHER): Payer: BC Managed Care – PPO | Admitting: Genetic Counselor

## 2014-03-05 DIAGNOSIS — Z803 Family history of malignant neoplasm of breast: Secondary | ICD-10-CM

## 2014-03-05 DIAGNOSIS — Z808 Family history of malignant neoplasm of other organs or systems: Secondary | ICD-10-CM

## 2014-03-05 DIAGNOSIS — Z8041 Family history of malignant neoplasm of ovary: Secondary | ICD-10-CM

## 2014-03-05 DIAGNOSIS — Z800 Family history of malignant neoplasm of digestive organs: Secondary | ICD-10-CM

## 2014-03-05 NOTE — Progress Notes (Signed)
HISTORY OF PRESENT ILLNESS: Dr. Nicki Reaper requested a cancer genetics consultation for ALSACE DOWD, a 48 y.o. female, due to a family history of cancer.  Ms. Popper presents to clinic today to discuss the possibility of a hereditary predisposition to cancer, genetic testing, and to further clarify her future cancer risks, as well as potential cancer risk for family members. Ms. Koval has no personal history of cancer.   Past Medical History  Diagnosis Date   Hypothyroidism    Anxiety    Anemia     Past Surgical History  Procedure Laterality Date   Tubal ligation  1991    bilateral   Back surgery  2009   Thyroidectomy     History   Social History   Marital Status: Married    Spouse Name: N/A    Number of Children: N/A   Years of Education: N/A   Social History Main Topics   Smoking status: Former Smoker    Types: Cigarettes    Quit date: 10/22/1996   Smokeless tobacco: Never Used   Alcohol Use: Yes     Comment: occasional   Drug Use: No   Sexual Activity: Not on file   Other Topics Concern   Not on file   Social History Narrative   No narrative on file     FAMILY HISTORY:  During the visit, a 4-generation pedigree was obtained. Significant diagnoses include the following:  Family History  Problem Relation Age of Onset   Ovarian cancer Mother    Mental illness Mother    Diabetes Mother    Heart disease Maternal Grandfather    Cancer Maternal Grandfather 50    colon   Cancer Maternal Aunt 5    blood cancer; unknown type   Cancer Maternal Grandmother 85    breast    Ms. Peel's ancestry is of Caucasian descent. There is no known Jewish ancestry or consanguinity.  GENETIC COUNSELING ASSESSMENT: Ms. Bently is a 48 y.o. female with a family history of cancer suggestive of a hereditary predisposition to cancer. We, therefore, discussed and recommended the following at today's visit.   DISCUSSION: We reviewed the  characteristics, features and inheritance patterns of hereditary cancer syndromes. We also discussed genetic testing, including the appropriate family members to test, the process of testing, insurance coverage and turn-around-time for results. We discussed the implications of a negative, positive and/or variant of uncertain significant result. We recommended Ms. Felkins pursue genetic testing for the Ovanext gene panel at [facility].   PLAN: Based on our above recommendation, Ms. Cranfield wished to pursue genetic testing and the blood sample was drawn and will be sent to [facility] for analysis. Results should be available within approximately 6 weeks time, at which point they will be disclosed by telephone to Ms. Sealey, as will any additional recommendations warranted by these results. We encouraged Ms. Durnil to remain in contact with cancer genetics annually so that we can continuously update the family history and inform her of any changes in cancer genetics and testing that may be of benefit for this family. Ms.  Kafer questions were answered to her satisfaction today. Our contact information was provided should additional questions or concerns arise.   Thank you for the referral and allowing [place] to share in the care of your patient.   The patient was seen for a total of 40 minutes in face-to-face counseling.    _______________________________________________________________________ For Office Staff:  Number of people involved in session: 2 Was  an Intern/ student involved with case: no

## 2014-03-09 ENCOUNTER — Other Ambulatory Visit: Payer: Self-pay | Admitting: Internal Medicine

## 2014-03-10 NOTE — Telephone Encounter (Signed)
Refilled xanax #30 with no refills.

## 2014-03-10 NOTE — Telephone Encounter (Signed)
Rx faxed to pharmacy  

## 2014-03-10 NOTE — Telephone Encounter (Signed)
Refill

## 2014-04-05 ENCOUNTER — Other Ambulatory Visit: Payer: Self-pay | Admitting: Internal Medicine

## 2014-04-06 NOTE — Telephone Encounter (Signed)
Refilled alprazolam #30 with no refills.  rx printed. Since I am not if office the rest of the week - may need to call in.

## 2014-04-06 NOTE — Telephone Encounter (Signed)
Last OV 1.30.15, last refill 5.20.15.  Please advise refill.

## 2014-04-06 NOTE — Telephone Encounter (Signed)
Rx called in left message on pharmacy VM as pharmacy is closed at this time.

## 2014-04-15 ENCOUNTER — Encounter: Payer: Self-pay | Admitting: Genetic Counselor

## 2014-04-15 DIAGNOSIS — Z803 Family history of malignant neoplasm of breast: Secondary | ICD-10-CM

## 2014-04-15 DIAGNOSIS — Z800 Family history of malignant neoplasm of digestive organs: Secondary | ICD-10-CM

## 2014-04-15 NOTE — Progress Notes (Signed)
HPI:  Rebecca Gallegos was previously seen in the [place] clinic due to a family history of cancer and concerns regarding a hereditary predisposition to cancer. Please refer to our prior cancer genetics clinic note for more information regarding Rebecca Gallegos's medical, social and family histories, and our assessment and recommendations, at the time. Rebecca Gallegos recent genetic test results were disclosed to her, as were recommendations warranted by these results. These results and recommendations are discussed in more detail below.  GENETIC TEST RESULTS: At the time of Rebecca Gallegos's visit, we recommended she pursue genetic testing of the OvaNext gene panel. This test, which included sequencing and deletion/duplication analysis of the genes listed on the test report, was performed at [facility]. Genetic testing was normal, and did not reveal a mutation in these genes. A complete list of all genes tested is located on the test report scanned into [employer].    We discussed with Rebecca Gallegos that since the current genetic testing is not perfect, it is possible there may be a gene mutation in one of these genes that current testing cannot detect, but that chance is small.  We also discussed, that it is possible that another gene that has not yet been discovered, or that we have not yet tested, is responsible for the cancer diagnoses in the family, and it is, therefore, important to remain in touch with cancer genetics in the future so that we can continue to offer Ms. Qadri the most up to date genetic testing.   CANCER SCREENING RECOMMENDATIONS: This normal result is reassuring and indicates that Rebecca Gallegos does not likely have an increased risk of cancer due to a a mutation in one of these genes.  We, therefore, recommended  Rebecca Gallegos continue to follow the cancer screening guidelines provided by her primary healthcare providers.   RECOMMENDATIONS FOR FAMILY MEMBERS:  While  these results are reassuring for Rebecca Gallegos, this test does not tell us anything about Rebecca Gallegos's siblings or maternal relatives' risks. We recommended these relatives also have genetic counseling and testing. Please let us know if we can help facilitate testing. Genetic counselors can be located in other cities, by visiting the website of the [employer] ([employer].se) and Field seismologist for a Dietitian by zip code.   FOLLOW-UP: Lastly, we discussed with Rebecca Gallegos that cancer genetics is a rapidly advancing field and it is possible that new genetic tests will be appropriate for her and/or her family members in the future. We encouraged her to remain in contact with cancer genetics on an annual basis so we can update her personal and family histories and let her know of advances in cancer genetics that may benefit this family.   Our contact number was provided. Rebecca Gallegos questions were answered to her satisfaction, and she knows she is welcome to call us at anytime with additional questions or concerns. This patient was discussed with Dr. Nicki Reaper who agrees with the above.   Catherine A. Fine, MS, CGC Certified Genetic Counseor phone: [phone] [email or web address]

## 2014-05-02 ENCOUNTER — Other Ambulatory Visit: Payer: Self-pay | Admitting: Internal Medicine

## 2014-05-03 NOTE — Telephone Encounter (Signed)
Last refill 6.16.15, last OV 1.30.15, future OV 8.4.15.  Please advise refill.

## 2014-05-03 NOTE — Telephone Encounter (Signed)
Refilled xanax #30 with no refills.  Signed and placed on your desk.

## 2014-05-04 NOTE — Telephone Encounter (Signed)
Rx faxed

## 2014-05-25 ENCOUNTER — Other Ambulatory Visit (HOSPITAL_COMMUNITY)
Admission: RE | Admit: 2014-05-25 | Discharge: 2014-05-25 | Disposition: A | Discharge status: Home / Self Care | Payer: BC Managed Care – PPO | Source: Ambulatory Visit | Attending: Internal Medicine | Admitting: Internal Medicine

## 2014-05-25 ENCOUNTER — Ambulatory Visit (INDEPENDENT_AMBULATORY_CARE_PROVIDER_SITE_OTHER): Payer: BC Managed Care – PPO | Admitting: Internal Medicine

## 2014-05-25 ENCOUNTER — Encounter: Payer: Self-pay | Admitting: Internal Medicine

## 2014-05-25 ENCOUNTER — Telehealth: Payer: Self-pay | Admitting: Internal Medicine

## 2014-05-25 VITALS — BP 118/80 | HR 67 | Temp 98.0°F | Ht 67.0 in | Wt 179.0 lb

## 2014-05-25 DIAGNOSIS — Z13220 Encounter for screening for lipoid disorders: Secondary | ICD-10-CM

## 2014-05-25 DIAGNOSIS — Z803 Family history of malignant neoplasm of breast: Secondary | ICD-10-CM

## 2014-05-25 DIAGNOSIS — Z1151 Encounter for screening for human papillomavirus (HPV): Secondary | ICD-10-CM | POA: Insufficient documentation

## 2014-05-25 DIAGNOSIS — F411 Generalized anxiety disorder: Secondary | ICD-10-CM

## 2014-05-25 DIAGNOSIS — D649 Anemia, unspecified: Secondary | ICD-10-CM

## 2014-05-25 DIAGNOSIS — N926 Irregular menstruation, unspecified: Secondary | ICD-10-CM

## 2014-05-25 DIAGNOSIS — E039 Hypothyroidism, unspecified: Secondary | ICD-10-CM

## 2014-05-25 DIAGNOSIS — Z01419 Encounter for gynecological examination (general) (routine) without abnormal findings: Secondary | ICD-10-CM | POA: Insufficient documentation

## 2014-05-25 DIAGNOSIS — F419 Anxiety disorder, unspecified: Secondary | ICD-10-CM

## 2014-05-25 LAB — CBC WITH DIFFERENTIAL/PLATELET
BASOS ABS: 0 10*3/uL (ref 0.0–0.1)
BASOS PCT: 0.6 % (ref 0.0–3.0)
Eosinophils Absolute: 0.1 10*3/uL (ref 0.0–0.7)
Eosinophils Relative: 2.4 % (ref 0.0–5.0)
HCT: 38.9 % (ref 36.0–46.0)
HEMOGLOBIN: 13 g/dL (ref 12.0–15.0)
LYMPHS PCT: 32 % (ref 12.0–46.0)
Lymphs Abs: 1.9 10*3/uL (ref 0.7–4.0)
MCHC: 33.5 g/dL (ref 30.0–36.0)
MCV: 84.4 fl (ref 78.0–100.0)
MONOS PCT: 7.8 % (ref 3.0–12.0)
Monocytes Absolute: 0.5 10*3/uL (ref 0.1–1.0)
Neutro Abs: 3.3 10*3/uL (ref 1.4–7.7)
Neutrophils Relative %: 57.2 % (ref 43.0–77.0)
Platelets: 224 10*3/uL (ref 150.0–400.0)
RBC: 4.61 Mil/uL (ref 3.87–5.11)
RDW: 18.7 % — ABNORMAL HIGH (ref 11.5–15.5)
WBC: 5.9 10*3/uL (ref 4.0–10.5)

## 2014-05-25 LAB — COMPREHENSIVE METABOLIC PANEL
ALT: 15 U/L (ref 0–35)
AST: 18 U/L (ref 0–37)
Albumin: 3.8 g/dL (ref 3.5–5.2)
Alkaline Phosphatase: 47 U/L (ref 39–117)
BUN: 9 mg/dL (ref 6–23)
CHLORIDE: 103 meq/L (ref 96–112)
CO2: 27 mEq/L (ref 19–32)
Calcium: 8.4 mg/dL (ref 8.4–10.5)
Creatinine, Ser: 0.6 mg/dL (ref 0.4–1.2)
GFR: 110.64 mL/min (ref >60.00)
GLUCOSE: 96 mg/dL (ref 70–99)
Potassium: 4.3 mEq/L (ref 3.5–5.1)
Sodium: 137 mEq/L (ref 135–145)
Total Bilirubin: 0.3 mg/dL (ref 0.2–1.2)
Total Protein: 6.8 g/dL (ref 6.0–8.3)

## 2014-05-25 LAB — LIPID PANEL
CHOL/HDL RATIO: 3
CHOLESTEROL: 175 mg/dL (ref 0–200)
HDL: 57.9 mg/dL (ref >39.00)
LDL Cholesterol: 97 mg/dL (ref 0–99)
NonHDL: 117.1
Triglycerides: 100 mg/dL (ref 0.0–149.0)
VLDL: 20 mg/dL (ref 0.0–40.0)

## 2014-05-25 LAB — TSH: TSH: 5.68 u[IU]/mL — ABNORMAL HIGH (ref 0.35–4.50)

## 2014-05-25 LAB — FERRITIN: Ferritin: 17 ng/mL (ref 10.0–291.0)

## 2014-05-25 NOTE — Progress Notes (Signed)
  Subjective:    Patient ID: Rebecca Gallegos, female    DOB: 1965/12/08, 48 y.o.   MRN: [id number]  HPI 48 year old female with past history of anxiety, anemia and hypothyroidism who comes in today to follow up on these issues as well as for a complete physical exam.   Was previously having increased vaginal bleeding.  Persistent.  Was evaluated by gyn.  Had repeat ultrasound.  Was given hormones.  Bleeding stopped temporarily.  Restarted.  Is now s/p ablation.  Doing well.  No bleeding or spotting.   Is taking her iron.  No light headedness or dizziness.  Breathing stable.   No acid reflux.  Not exercising.  Plans to restart.  Concerned over weight gain.  Recently had genetic testing.  Negative.       Past Medical History  Diagnosis Date  . Hypothyroidism   . Anxiety   . Anemia     Current Outpatient Prescriptions on File Prior to Visit  Medication Sig Dispense Refill  . ALPRAZolam (XANAX) 0.25 MG tablet TAKE 1 TABLET BY MOUTH EVERY DAY AS NEEDED  30 tablet  0  . ferrous sulfate 325 (65 FE) MG tablet Take 325 mg by mouth daily.      Marland Kitchen levothyroxine (SYNTHROID, LEVOTHROID) 150 MCG tablet TAKE 1 TABLET BY MOUTH DAILY  90 tablet  3  . NAPROXEN PO Take by mouth 2 (two) times daily.       No current facility-administered medications on file prior to visit.    Review of Systems Patient denies any headache.  No light headedness or dizziness.  No sinus or allergy symptoms.  No chest pain, tightness or palpitations.  No increased shortness of breath.  No cough or congestion.  No nausea or vomiting.  No acid reflux.  No bowel change.  No vaginal bleeding or spotting now.  Not needing xanax now.  Has not taken in over one month.  Concerned over some weight gain.  Not exercising.  Stress better.        Objective:   Physical Exam  Filed Vitals:   05/25/14 0830  BP: 118/80  Pulse: 67  Temp: 98 F (57.60 C)   48 year old female in no acute distress.   HEENT:  Nares- clear.  Oropharynx -  without lesions. NECK:  Supple.  Nontender.  No audible bruit.  HEART:  Appears to be regular. LUNGS:  No crackles or wheezing audible.  Respirations even and unlabored.  RADIAL PULSE:  Equal bilaterally.    BREASTS:  No nipple discharge or nipple retraction present.  Could not appreciate any distinct nodules or axillary adenopathy.  ABDOMEN:  Soft, nontender.  Bowel sounds present and normal.  No audible abdominal bruit.  GU:  Normal external genitalia.  Vaginal vault without lesions.  Cervix identified.  Pap performed. Could not appreciate any adnexal masses or tenderness.   RECTAL:  Heme negative.   EXTREMITIES:  No increased edema present.  DP pulses palpable and equal bilaterally.             Assessment & Plan:  BACK PAIN.  Saw ortho.  Controlling with Alleve.  Stable.   CARDIOVASCULAR.  Asymptomatic.    HEALTH MAINTENANCE. Physical today.   Mammogram 06/19/13 ([facility]) - BiRADS I.  She will schedule f/u mammogram.   I spent 25 minutes with the patient and more than 50% of the time was spent in consultation regarding the above.

## 2014-05-25 NOTE — Progress Notes (Signed)
Pre visit review using our clinic review tool, if applicable. No additional management support is needed unless otherwise documented below in the visit note. 

## 2014-05-25 NOTE — Telephone Encounter (Signed)
Pt notified of lab results via my chart.  Needs a non fasting lab appt in 6 weeks.  Please schedule and contact her with an appt date and time.    Dr Nicki Reaper

## 2014-05-26 ENCOUNTER — Telehealth: Payer: Self-pay | Admitting: Internal Medicine

## 2014-05-26 ENCOUNTER — Encounter: Payer: Self-pay | Admitting: Internal Medicine

## 2014-05-26 NOTE — Telephone Encounter (Signed)
Pt left without scheduling a f/u appt.  Needs f/u appt in 6 months (25min).  Thanks.

## 2014-05-26 NOTE — Assessment & Plan Note (Signed)
On thyroid replacement.  Follow tsh.  

## 2014-05-26 NOTE — Assessment & Plan Note (Signed)
Better.  Has not required xanax within the last month.

## 2014-05-26 NOTE — Assessment & Plan Note (Signed)
Continue iron supplements.  Follow cbc.  Recheck cbc.  Vaginal bleeding stopped.

## 2014-05-26 NOTE — Assessment & Plan Note (Signed)
S/p ablation.  Doing well.  No bleeding or spotting now.

## 2014-05-26 NOTE — Assessment & Plan Note (Signed)
Saw a Dietitian.  Genetic testing negative.

## 2014-05-27 ENCOUNTER — Encounter: Payer: Self-pay | Admitting: Internal Medicine

## 2014-05-27 LAB — CYTOLOGY - PAP

## 2014-05-27 NOTE — Telephone Encounter (Signed)
Unread mychart message mailed to patient 

## 2014-05-28 MED ORDER — LEVOTHYROXINE SODIUM 175 MCG PO TABS: 175.0000 ug | ORAL_TABLET | Freq: Every day | ORAL | Status: DC

## 2014-05-28 NOTE — Addendum Note (Signed)
Addended by: Wynonia Lawman E on: 05/28/2014 08:21 AM   Modules accepted: Orders

## 2014-06-05 ENCOUNTER — Other Ambulatory Visit: Payer: Self-pay | Admitting: Internal Medicine

## 2014-06-07 NOTE — Telephone Encounter (Signed)
Refill

## 2014-06-07 NOTE — Telephone Encounter (Signed)
Does she still need this?  Per last note, she had told me she was not needing now.  How often taking?  I am ok to refill x 1, just wanted to clarify need for medication.

## 2014-07-01 ENCOUNTER — Other Ambulatory Visit: Payer: Self-pay

## 2014-07-01 DIAGNOSIS — Z1231 Encounter for screening mammogram for malignant neoplasm of breast: Secondary | ICD-10-CM

## 2014-07-07 ENCOUNTER — Other Ambulatory Visit: Payer: BC Managed Care – PPO

## 2014-07-12 ENCOUNTER — Ambulatory Visit: Payer: BC Managed Care – PPO

## 2014-07-20 ENCOUNTER — Other Ambulatory Visit (INDEPENDENT_AMBULATORY_CARE_PROVIDER_SITE_OTHER): Payer: BC Managed Care – PPO

## 2014-07-20 DIAGNOSIS — E039 Hypothyroidism, unspecified: Secondary | ICD-10-CM

## 2014-07-20 LAB — TSH: TSH: 0.69 u[IU]/mL (ref 0.35–4.50)

## 2014-07-21 ENCOUNTER — Ambulatory Visit: Payer: BC Managed Care – PPO

## 2014-07-21 ENCOUNTER — Encounter: Payer: Self-pay | Admitting: Internal Medicine

## 2014-08-02 ENCOUNTER — Ambulatory Visit
Admission: RE | Admit: 2014-08-02 | Discharge: 2014-08-02 | Disposition: A | Discharge status: Home / Self Care | Payer: BC Managed Care – PPO | Source: Ambulatory Visit

## 2014-08-02 DIAGNOSIS — Z1231 Encounter for screening mammogram for malignant neoplasm of breast: Secondary | ICD-10-CM

## 2014-08-04 NOTE — Telephone Encounter (Signed)
Unread message mailed to patient. 

## 2014-11-26 ENCOUNTER — Ambulatory Visit (INDEPENDENT_AMBULATORY_CARE_PROVIDER_SITE_OTHER): Payer: Managed Care, Other (non HMO) | Admitting: Internal Medicine

## 2014-11-26 ENCOUNTER — Encounter: Payer: Self-pay | Admitting: Internal Medicine

## 2014-11-26 VITALS — BP 120/80 | HR 67 | Temp 98.7°F | Ht 67.0 in | Wt 183.5 lb

## 2014-11-26 DIAGNOSIS — H6120 Impacted cerumen, unspecified ear: Secondary | ICD-10-CM

## 2014-11-26 DIAGNOSIS — D649 Anemia, unspecified: Secondary | ICD-10-CM

## 2014-11-26 DIAGNOSIS — F419 Anxiety disorder, unspecified: Secondary | ICD-10-CM

## 2014-11-26 DIAGNOSIS — Z1211 Encounter for screening for malignant neoplasm of colon: Secondary | ICD-10-CM

## 2014-11-26 DIAGNOSIS — Z23 Encounter for immunization: Secondary | ICD-10-CM

## 2014-11-26 DIAGNOSIS — E039 Hypothyroidism, unspecified: Secondary | ICD-10-CM

## 2014-11-26 DIAGNOSIS — Z803 Family history of malignant neoplasm of breast: Secondary | ICD-10-CM

## 2014-11-26 DIAGNOSIS — Z800 Family history of malignant neoplasm of digestive organs: Secondary | ICD-10-CM

## 2014-11-26 LAB — TSH: TSH: 1.39 u[IU]/mL (ref 0.35–4.50)

## 2014-11-26 LAB — CBC WITH DIFFERENTIAL/PLATELET
BASOS ABS: 0 10*3/uL (ref 0.0–0.1)
Basophils Relative: 0.8 % (ref 0.0–3.0)
EOS PCT: 3.3 % (ref 0.0–5.0)
Eosinophils Absolute: 0.2 10*3/uL (ref 0.0–0.7)
HCT: 42.6 % (ref 36.0–46.0)
Hemoglobin: 14.6 g/dL (ref 12.0–15.0)
LYMPHS ABS: 1.7 10*3/uL (ref 0.7–4.0)
Lymphocytes Relative: 30.6 % (ref 12.0–46.0)
MCHC: 34.3 g/dL (ref 30.0–36.0)
MCV: 86.3 fl (ref 78.0–100.0)
MONO ABS: 0.5 10*3/uL (ref 0.1–1.0)
Monocytes Relative: 8.5 % (ref 3.0–12.0)
NEUTROS ABS: 3.2 10*3/uL (ref 1.4–7.7)
Neutrophils Relative %: 56.8 % (ref 43.0–77.0)
PLATELETS: 218 10*3/uL (ref 150.0–400.0)
RBC: 4.93 Mil/uL (ref 3.87–5.11)
RDW: 13 % (ref 11.5–15.5)
WBC: 5.7 10*3/uL (ref 4.0–10.5)

## 2014-11-26 LAB — FERRITIN: FERRITIN: 32.8 ng/mL (ref 10.0–291.0)

## 2014-11-26 MED ORDER — LEVOTHYROXINE SODIUM 175 MCG PO TABS: 175.0000 ug | ORAL_TABLET | Freq: Every day | ORAL | Status: DC

## 2014-11-26 MED ORDER — CARBAMIDE PEROXIDE 6.5 % OT SOLN: OTIC | Status: DC

## 2014-11-26 NOTE — Progress Notes (Signed)
Patient ID: Rebecca Gallegos, female   DOB: 03/10/1966, 49 y.o.   MRN: [id number]   Subjective:    Patient ID: Rebecca Gallegos, female    DOB: 1966-07-31, 49 y.o.   MRN: [id number]  HPI  Patient here for a scheduled follow up.  Has a history of hypothyroidism and remote history of anemia.  She states she is doing well.  She is taking her iron daily.  Has been back on for 5 months.  She reports increased pain in her feet.  Worse in am - when first gets up.  She has bought new shoes and has inserts.  Some better.  Still increased pain.  Left ear crackling.  No pain.  No hearing change.  Bowels stable.  Breathing stable.     Past Medical History  Diagnosis Date  . Hypothyroidism   . Anxiety   . Anemia     Current Outpatient Prescriptions on File Prior to Visit  Medication Sig Dispense Refill  . ALPRAZolam (XANAX) 0.25 MG tablet TAKE 1 TABLET BY MOUTH EVERY DAY AS NEEDED 30 tablet 0  . Calcium-Vitamin D-Vitamin K (CALCIUM SOFT CHEWS) 500-100-40 MG-UNT-MCG CHEW Chew by mouth.    . ferrous sulfate 325 (65 FE) MG tablet Take 325 mg by mouth daily.    Marland Kitchen NAPROXEN PO Take by mouth 2 (two) times daily.     No current facility-administered medications on file prior to visit.    Review of Systems  Constitutional: Negative for fever and fatigue.  HENT: Negative for congestion and sinus pressure.   Respiratory: Negative for cough, chest tightness and shortness of breath.   Cardiovascular: Negative for chest pain, palpitations and leg swelling.  Gastrointestinal: Negative for nausea, vomiting, abdominal pain, diarrhea and constipation.  Musculoskeletal: Negative for back pain and joint swelling.       Bilateral foot pain.  Worse in the am when she first wakes up.    Neurological: Negative for dizziness, light-headedness and headaches.       Objective:    Physical Exam  HENT:  Nose: Nose normal.  Mouth/Throat: Oropharynx is clear and moist.  Neck: Neck supple. No thyromegaly present.   Cardiovascular: Normal rate and regular rhythm.   Pulmonary/Chest: Breath sounds normal. No respiratory distress. She has no wheezes.  Abdominal: Soft. Bowel sounds are normal. There is no tenderness.  Musculoskeletal: She exhibits no edema or tenderness.  Minimal foot tenderness to palpation over the base of the heel.  No erythema  Lymphadenopathy:    She has no cervical adenopathy.    BP 120/80 mmHg  Pulse 67  Temp(Src) 98.7 F (37.1 C) (Oral)  Ht 5\' 7"  (1.702 m)  Wt 183 lb 8 oz (83.235 kg)  BMI 28.73 kg/m2  SpO2 97% Wt Readings from Last 3 Encounters:  11/26/14 183 lb 8 oz (83.235 kg)  05/25/14 179 lb (81.194 kg)  11/20/13 178 lb (80.74 kg)     Lab Results  Component Value Date   WBC 5.7 11/26/2014   HGB 14.6 11/26/2014   HCT 42.6 11/26/2014   PLT 218.0 11/26/2014   GLUCOSE 96 05/25/2014   CHOL 175 05/25/2014   TRIG 100.0 05/25/2014   HDL 57.90 05/25/2014   LDLCALC 97 05/25/2014   ALT 15 05/25/2014   AST 18 05/25/2014   NA 137 05/25/2014   K 4.3 05/25/2014   CL 103 05/25/2014   CREATININE 0.6 05/25/2014   BUN 9 05/25/2014   CO2 27 05/25/2014   TSH 1.39 11/26/2014  INR 0.97 01/04/2010    Mm Screening Breast Tomo Bilateral  08/03/2014   CLINICAL DATA:  Screening.  EXAM: DIGITAL SCREENING BILATERAL MAMMOGRAM WITH 3D TOMO WITH CAD  COMPARISON:  Previous exam(s).  ACR Breast Density Category c: The breast tissue is heterogeneously dense, which may obscure small masses.  FINDINGS: There are no findings suspicious for malignancy. Images were processed with CAD.  IMPRESSION: No mammographic evidence of malignancy. A result letter of this screening mammogram will be mailed directly to the patient.  RECOMMENDATION: Screening mammogram in one year. (Code:[id number]-B-01Y)  BI-RADS CATEGORY  1: Negative.   Electronically Signed   By: Lovey Newcomer M.D.   On: 08/03/2014 16:13       Assessment & Plan:   Problem List Items Addressed This Visit    Anemia    On iron.  Restarted  daily approximately five months ago.  Check cbc and ferritin today.        Relevant Orders   CBC with Differential/Platelet (Completed)   Ferritin (Completed)   Anxiety    Doing well.  Not requiring xanax now.        Cerumen impaction    Left ear cerumen impaction.  Debrox.  Return for ear irrigation.       Family history of malignant neoplasm of gastrointestinal tract    Just recently found out had family history of colon cancer.  Will refer to GI for evaluation for screening colonoscopy.        Family history of malignant neoplasm of breast    Mammogram 08/02/14 - [facility].        Hypothyroidism - Primary    On thyroid replacement.  Just adjusted recently.  Check tsh today.        Relevant Medications   levothyroxine (SYNTHROID, LEVOTHROID) tablet   Other Relevant Orders   TSH (Completed)    Other Visit Diagnoses    Encounter for immunization        Colon cancer screening        Relevant Orders    Ambulatory referral to Gastroenterology       I spent 25 minutes with the patient and more than 50% of the time was spent in consultation regarding the above.    Einar Pheasant, MD

## 2014-11-26 NOTE — Assessment & Plan Note (Signed)
Doing well.  Not requiring xanax now.

## 2014-11-26 NOTE — Assessment & Plan Note (Signed)
Mammogram 08/02/14 - [facility].

## 2014-11-26 NOTE — Assessment & Plan Note (Signed)
On thyroid replacement.  Just adjusted recently.  Check tsh today.

## 2014-11-26 NOTE — Assessment & Plan Note (Signed)
Just recently found out had family history of colon cancer.  Will refer to GI for evaluation for screening colonoscopy.

## 2014-11-26 NOTE — Progress Notes (Signed)
Pre visit review using our clinic review tool, if applicable. No additional management support is needed unless otherwise documented below in the visit note. 

## 2014-11-26 NOTE — Assessment & Plan Note (Signed)
On iron.  Restarted daily approximately five months ago.  Check cbc and ferritin today.

## 2014-11-27 ENCOUNTER — Encounter: Payer: Self-pay | Admitting: Internal Medicine

## 2014-11-28 ENCOUNTER — Encounter: Payer: Self-pay | Admitting: Internal Medicine

## 2014-11-28 DIAGNOSIS — H6120 Impacted cerumen, unspecified ear: Secondary | ICD-10-CM | POA: Insufficient documentation

## 2014-11-28 NOTE — Assessment & Plan Note (Signed)
Left ear cerumen impaction.  Debrox.  Return for ear irrigation.  

## 2014-11-30 NOTE — Telephone Encounter (Signed)
Unread mychart message mailed to patient 

## 2014-12-06 ENCOUNTER — Encounter: Payer: Self-pay | Admitting: Internal Medicine

## 2014-12-12 ENCOUNTER — Telehealth: Payer: Self-pay | Admitting: Internal Medicine

## 2014-12-12 DIAGNOSIS — E039 Hypothyroidism, unspecified: Secondary | ICD-10-CM

## 2014-12-12 MED ORDER — LEVOTHYROXINE SODIUM 150 MCG PO TABS: 150.0000 ug | ORAL_TABLET | Freq: Every day | ORAL | Status: DC

## 2014-12-12 NOTE — Telephone Encounter (Signed)
Pt requested prescription for synthroid 157mcg #90 with one refill to be sent to mail order.  Needs tsh in 3 months.  Please schedule a non fasting lab in 3 months and notify pt of appt date and time.  Thanks.    Dr Nicki Reaper

## 2014-12-21 ENCOUNTER — Ambulatory Visit (INDEPENDENT_AMBULATORY_CARE_PROVIDER_SITE_OTHER): Payer: Managed Care, Other (non HMO) | Admitting: *Deleted

## 2014-12-21 DIAGNOSIS — H6122 Impacted cerumen, left ear: Secondary | ICD-10-CM | POA: Diagnosis not present

## 2014-12-21 NOTE — Progress Notes (Signed)
Patient seen in clinic for ear irrigation to left ear large piece of cerumen lodged in canal on posterior wall of ear cerumen was successfully irrigated from canal of left ear with warm water and peroxide. Flushed for 3 min.

## 2015-01-18 ENCOUNTER — Ambulatory Visit (AMBULATORY_SURGERY_CENTER): Payer: Self-pay | Admitting: *Deleted

## 2015-01-18 VITALS — Ht 67.0 in | Wt 187.0 lb

## 2015-01-18 DIAGNOSIS — Z1211 Encounter for screening for malignant neoplasm of colon: Secondary | ICD-10-CM

## 2015-01-18 MED ORDER — MOVIPREP 100 G PO SOLR: ORAL | Status: DC

## 2015-01-18 NOTE — Progress Notes (Signed)
No egg or soy allergy  No anesthesia or intubation problems per pt  No diet medications taken   

## 2015-02-02 ENCOUNTER — Encounter: Payer: Self-pay | Admitting: Internal Medicine

## 2015-02-02 ENCOUNTER — Ambulatory Visit (AMBULATORY_SURGERY_CENTER): Payer: Managed Care, Other (non HMO) | Admitting: Internal Medicine

## 2015-02-02 VITALS — BP 115/48 | HR 63 | Temp 97.7°F | Resp 19 | Ht 67.0 in | Wt 187.0 lb

## 2015-02-02 DIAGNOSIS — D123 Benign neoplasm of transverse colon: Secondary | ICD-10-CM | POA: Diagnosis not present

## 2015-02-02 DIAGNOSIS — Z1211 Encounter for screening for malignant neoplasm of colon: Secondary | ICD-10-CM | POA: Diagnosis not present

## 2015-02-02 MED ORDER — SODIUM CHLORIDE 0.9 % IV SOLN: 500.0000 mL | INTRAVENOUS | Status: DC

## 2015-02-02 NOTE — Op Note (Signed)
[place]  [employer]. [place], [address]   COLONOSCOPY PROCEDURE REPORT  PATIENT: Rebecca, Gallegos  MR#: [id number] BIRTHDATE: 04/27/1966 , 49  yrs. old GENDER: female ENDOSCOPIST: Jerene Bears, MD REFERRED BY: Einar Pheasant, MD PROCEDURE DATE:  02/02/2015 PROCEDURE:   Colonoscopy with cold biopsy polypectomy First Screening Colonoscopy - Avg.  risk and is 50 yrs.  old or older Yes.  Prior Negative Screening - Now for repeat screening. N/A  History of Adenoma - Now for follow-up colonoscopy & has been > or = to 3 yrs.  N/A ASA CLASS:   Class II INDICATIONS:Screening for colonic neoplasia and Colorectal Neoplasm Risk Assessment for this procedure is average risk. MEDICATIONS: Monitored anesthesia care and Propofol 380 mg IV  DESCRIPTION OF PROCEDURE:   After the risks benefits and alternatives of the procedure were thoroughly explained, informed consent was obtained.  The digital rectal exam revealed no rectal mass.   The LB [id number]-[id number]  endoscope was introduced through the anus and advanced to the cecum, which was identified by both the appendix and ileocecal valve. No adverse events experienced. The quality of the prep was good.  (MoviPrep was used)  The instrument was then slowly withdrawn as the colon was fully examined.  COLON FINDINGS: A sessile polyp measuring 2 mm in size was found in the transverse colon.  A polypectomy was performed with cold forceps.  The resection was complete, the polyp tissue was completely retrieved and sent to histology.   There was mild diverticulosis noted in the sigmoid colon.  Retroflexed views revealed internal hemorrhoids. The time to cecum = 2.4 Withdrawal time = 12.2   The scope was withdrawn and the procedure completed. COMPLICATIONS: There were no immediate complications.  ENDOSCOPIC IMPRESSION: 1.   Sessile polyp was found in the transverse colon; polypectomy was performed with cold forceps 2.   Mild  diverticulosis was noted in the sigmoid colon  RECOMMENDATIONS: 1.  Await pathology results 2.  If the polyp removed today is proven to be an adenomatous (pre-cancerous) polyp, you will need a repeat colonoscopy in 5 years.  Otherwise you should continue to follow colorectal cancer screening guidelines for "routine risk" patients with colonoscopy in 10 years.  You will receive a letter within 1-2 weeks with the results of your biopsy as well as final recommendations.  Please call my office if you have not received a letter after 3 weeks.  eSigned:  Jerene Bears, MD 02/02/2015 11:12 AM   cc: The Patient, Einar Pheasant, MD

## 2015-02-02 NOTE — Patient Instructions (Signed)
YOU HAD AN ENDOSCOPIC PROCEDURE TODAY AT [facility]:   Refer to the procedure report that was given to you for any specific questions about what was found during the examination.  If the procedure report does not answer your questions, please call your gastroenterologist to clarify.  If you requested that your care partner not be given the details of your procedure findings, then the procedure report has been included in a sealed envelope for you to review at your convenience later.  YOU SHOULD EXPECT: Some feelings of bloating in the abdomen. Passage of more gas than usual.  Walking can help get rid of the air that was put into your GI tract during the procedure and reduce the bloating. If you had a lower endoscopy (such as a colonoscopy or flexible sigmoidoscopy) you may notice spotting of blood in your stool or on the toilet paper. If you underwent a bowel prep for your procedure, you may not have a normal bowel movement for a few days.  Please Note:  You might notice some irritation and congestion in your nose or some drainage.  This is from the oxygen used during your procedure.  There is no need for concern and it should clear up in a day or so.  SYMPTOMS TO REPORT IMMEDIATELY:   Following lower endoscopy (colonoscopy or flexible sigmoidoscopy):  Excessive amounts of blood in the stool  Significant tenderness or worsening of abdominal pains  Swelling of the abdomen that is new, acute  Fever of 100F or higher   For urgent or emergent issues, a gastroenterologist can be reached at any hour by calling [phone].   DIET: Your first meal following the procedure should be a small meal and then it is ok to progress to your normal diet. Heavy or fried foods are harder to digest and may make you feel nauseous or bloated.  Likewise, meals heavy in dairy and vegetables can increase bloating.  Drink plenty of fluids but you should avoid alcoholic beverages for 24  hours.  ACTIVITY:  You should plan to take it easy for the rest of today and you should NOT DRIVE or use heavy machinery until tomorrow (because of the sedation medicines used during the test).    FOLLOW UP: Our staff will call the number listed on your records the next business day following your procedure to check on you and address any questions or concerns that you may have regarding the information given to you following your procedure. If we do not reach you, we will leave a message.  However, if you are feeling well and you are not experiencing any problems, there is no need to return our call.  We will assume that you have returned to your regular daily activities without incident.  If any biopsies were taken you will be contacted by phone or by letter within the next 1-3 weeks.  Please call us at [phone] if you have not heard about the biopsies in 3 weeks.    SIGNATURES/CONFIDENTIALITY: You and/or your care partner have signed paperwork which will be entered into your electronic medical record.  These signatures attest to the fact that that the information above on your After Visit Summary has been reviewed and is understood.  Full responsibility of the confidentiality of this discharge information lies with you and/or your care-partner.    Handouts were given to your care partner on polyps, diverticulosis,and a high fiber diet with liberal fluid intake. You might notice some  irritation in your nose or drainage.  This may cause feelings of congestion.  This is from the oxygen, which can be drying.  This is no cause for concern; this should clear up in a few days.  You may resume your current medications today. Await biopsy results. Please call if any questions or concerns.

## 2015-02-02 NOTE — Progress Notes (Signed)
Called to room to assist during endoscopic procedure.  Patient ID and intended procedure confirmed with present staff. Received instructions for my participation in the procedure from the performing physician.  

## 2015-02-02 NOTE — Progress Notes (Signed)
A/ox3 pleased with MAC, report to Annette RN 

## 2015-02-02 NOTE — Progress Notes (Signed)
No problems noted in the recovery room. maw 

## 2015-02-03 ENCOUNTER — Telehealth: Payer: Self-pay | Admitting: *Deleted

## 2015-02-03 NOTE — Telephone Encounter (Signed)
  Follow up Call-  Call back number 02/02/2015  Post procedure Call Back phone  # [phone]  Permission to leave phone message Yes     Patient questions:  Do you have a fever, pain , or abdominal swelling? No. Pain Score  0 *  Have you tolerated food without any problems? Yes.    Have you been able to return to your normal activities? Yes.    Do you have any questions about your discharge instructions: Diet   No. Medications  No. Follow up visit  No.  Do you have questions or concerns about your Care? No.  Actions: * If pain score is 4 or above: No action needed, pain <4.

## 2015-02-07 ENCOUNTER — Encounter: Payer: Self-pay | Admitting: Internal Medicine

## 2015-03-14 ENCOUNTER — Other Ambulatory Visit: Payer: Managed Care, Other (non HMO)

## 2015-03-22 ENCOUNTER — Encounter: Payer: Self-pay | Admitting: Internal Medicine

## 2015-03-30 ENCOUNTER — Other Ambulatory Visit (INDEPENDENT_AMBULATORY_CARE_PROVIDER_SITE_OTHER): Payer: Managed Care, Other (non HMO)

## 2015-03-30 DIAGNOSIS — E039 Hypothyroidism, unspecified: Secondary | ICD-10-CM | POA: Diagnosis not present

## 2015-03-30 LAB — TSH: TSH: 11.03 u[IU]/mL — ABNORMAL HIGH (ref 0.35–4.50)

## 2015-03-31 ENCOUNTER — Encounter: Payer: Self-pay | Admitting: *Deleted

## 2015-03-31 ENCOUNTER — Other Ambulatory Visit: Payer: Self-pay | Admitting: Internal Medicine

## 2015-03-31 DIAGNOSIS — E039 Hypothyroidism, unspecified: Secondary | ICD-10-CM

## 2015-03-31 NOTE — Progress Notes (Signed)
Order placed for tsh 

## 2015-04-01 ENCOUNTER — Other Ambulatory Visit: Payer: Self-pay | Admitting: *Deleted

## 2015-04-01 MED ORDER — LEVOTHYROXINE SODIUM 175 MCG PO TABS: 175.0000 ug | ORAL_TABLET | Freq: Every day | ORAL | Status: DC

## 2015-04-01 NOTE — Telephone Encounter (Signed)
Called pt, see result note

## 2015-04-06 ENCOUNTER — Encounter (HOSPITAL_COMMUNITY): Payer: Self-pay | Admitting: Obstetrics and Gynecology

## 2015-04-06 DIAGNOSIS — Z8041 Family history of malignant neoplasm of ovary: Secondary | ICD-10-CM

## 2015-04-06 DIAGNOSIS — Z9889 Other specified postprocedural states: Secondary | ICD-10-CM

## 2015-04-06 DIAGNOSIS — R102 Pelvic and perineal pain: Secondary | ICD-10-CM

## 2015-04-06 HISTORY — DX: Other specified postprocedural states: Z98.890

## 2015-04-06 HISTORY — DX: Pelvic and perineal pain: R10.2

## 2015-04-06 HISTORY — DX: Family history of malignant neoplasm of ovary: Z80.41

## 2015-04-21 ENCOUNTER — Encounter (HOSPITAL_COMMUNITY)
Admission: RE | Admit: 2015-04-21 | Discharge: 2015-04-21 | Disposition: A | Discharge status: Home / Self Care | Payer: Managed Care, Other (non HMO) | Source: Ambulatory Visit | Attending: Obstetrics and Gynecology | Admitting: Obstetrics and Gynecology

## 2015-04-21 ENCOUNTER — Encounter (HOSPITAL_COMMUNITY): Payer: Self-pay

## 2015-04-21 DIAGNOSIS — N857 Hematometra: Secondary | ICD-10-CM | POA: Insufficient documentation

## 2015-04-21 DIAGNOSIS — Z01818 Encounter for other preprocedural examination: Secondary | ICD-10-CM | POA: Diagnosis not present

## 2015-04-21 LAB — CBC
HEMATOCRIT: 40.9 % (ref 36.0–46.0)
HEMOGLOBIN: 14.1 g/dL (ref 12.0–15.0)
MCH: 29.6 pg (ref 26.0–34.0)
MCHC: 34.5 g/dL (ref 30.0–36.0)
MCV: 85.7 fL (ref 78.0–100.0)
PLATELETS: 230 10*3/uL (ref 150–400)
RBC: 4.77 MIL/uL (ref 3.87–5.11)
RDW: 13.2 % (ref 11.5–15.5)
WBC: 6.2 10*3/uL (ref 4.0–10.5)

## 2015-04-21 LAB — COMPREHENSIVE METABOLIC PANEL
ALBUMIN: 4.4 g/dL (ref 3.5–5.0)
ALT: 34 U/L (ref 14–54)
AST: 26 U/L (ref 15–41)
Alkaline Phosphatase: 55 U/L (ref 38–126)
Anion gap: 3 — ABNORMAL LOW (ref 5–15)
BUN: 9 mg/dL (ref 6–20)
CALCIUM: 8.9 mg/dL (ref 8.9–10.3)
CO2: 27 mmol/L (ref 22–32)
CREATININE: 0.58 mg/dL (ref 0.44–1.00)
Chloride: 105 mmol/L (ref 101–111)
GFR calc Af Amer: >60 mL/min (ref >60)
GFR calc non Af Amer: >60 mL/min (ref >60)
Glucose, Bld: 109 mg/dL — ABNORMAL HIGH (ref 65–99)
Potassium: 4.4 mmol/L (ref 3.5–5.1)
Sodium: 135 mmol/L (ref 135–145)
Total Bilirubin: 0.9 mg/dL (ref 0.3–1.2)
Total Protein: 7.8 g/dL (ref 6.5–8.1)

## 2015-04-21 NOTE — Patient Instructions (Signed)
Your procedure is scheduled on:05/06/15  Enter through the Main Entrance at :7:45 am Pick up desk phone and dial [phone] and inform us of your arrival.  Please call [phone] if you have any problems the morning of surgery.  Remember: Do not eat food or drink liquids, including water, after midnight:Thursday Water ok until:5am on Friday  You may brush your teeth the morning of surgery.  Take these meds the morning of surgery with a sip of water:Thyroid   DO NOT wear jewelry, eye make-up, lipstick,body lotion, or dark fingernail polish.  (Polished toes are ok) You may wear deodorant.  If you are to be admitted after surgery, leave suitcase in car until your room has been assigned. Patients discharged on the day of surgery will not be allowed to drive home. Wear loose fitting, comfortable clothes for your ride home.

## 2015-05-05 ENCOUNTER — Encounter (HOSPITAL_COMMUNITY): Payer: Self-pay | Admitting: Anesthesiology

## 2015-05-05 DIAGNOSIS — N857 Hematometra: Secondary | ICD-10-CM | POA: Diagnosis present

## 2015-05-05 HISTORY — DX: Hematometra: N85.7

## 2015-05-05 NOTE — Anesthesia Preprocedure Evaluation (Addendum)
Anesthesia Evaluation   Patient awake    Reviewed: Allergy & Precautions, H&P , NPO status , Patient's Chart, lab work & pertinent test results, reviewed documented beta blocker date and time , Unable to perform ROS - Chart review only  History of Anesthesia Complications Negative for: history of anesthetic complications  Airway Mallampati: III  TM Distance: >3 FB Neck ROM: full    Dental no notable dental hx.    Pulmonary former smoker,  breath sounds clear to auscultation  Pulmonary exam normal       Cardiovascular negative cardio ROS Normal cardiovascular examRhythm:regular Rate:Normal     Neuro/Psych PSYCHIATRIC DISORDERS Anxiety negative neurological ROS     GI/Hepatic negative GI ROS, Neg liver ROS,   Endo/Other  Hypothyroidism   Renal/GU negative Renal ROS     Musculoskeletal   Abdominal (+) + obese,   Peds  Hematology   Anesthesia Other Findings NPO appropriate, allergies reviewed Denies active cardiac or pulmonary symptoms, METS > 4 No recent congestive cough or symptoms of upper respiratory infection Meds - synthroid   Reproductive/Obstetrics negative OB ROS                            Anesthesia Physical Anesthesia Plan  ASA: II  Anesthesia Plan: General   Post-op Pain Management:    Induction: Intravenous  Airway Management Planned: Oral ETT  Additional Equipment:   Intra-op Plan:   Post-operative Plan: Extubation in OR  Informed Consent: I have reviewed the patients History and Physical, chart, labs and discussed the procedure including the risks, benefits and alternatives for the proposed anesthesia with the patient or authorized representative who has indicated his/her understanding and acceptance.   Dental Advisory Given  Plan Discussed with: Anesthesiologist  Anesthesia Plan Comments:        Anesthesia Quick Evaluation

## 2015-05-05 NOTE — H&P (Signed)
Rebecca Gallegos is an 49 y.o. female  [phone] with hematometra, pelvic pain, AUB/DUB after endometrial ablation in 4/15.  Also FH of ovarian cancer. Desires definitive management.  D/W pt r/b/a of LAVH/BSO, pt voices understanding wishes to proceed.  In office attempt to drain hematometra unsucessful.  Pertinent Gynecological History: Menses: Irregular after Novasure with pain Bleeding: dysfunctional uterine bleeding Contraception: tubal ligation Blood transfusions: none Sexually transmitted diseases: no past history Previous GYN Procedures: DNC x 2 Last mammogram: normal Date: 2016 Last pap: normal Date: 04/2013 HR HPV neg OB History: G5, P2032 SVD x 2, TAB x 2, SAB x 1    Menstrual History:  No LMP recorded. Patient has had an ablation.    Past Medical History  Diagnosis Date  . Hypothyroidism   . Anemia   . Anxiety     hx- has gone away since thyroid level controlled  . S/P endometrial ablation 04/06/2015  . Family history of ovarian cancer 04/06/2015  . Female pelvic pain 04/06/2015  . Hematometra 05/05/2015    Past Surgical History  Procedure Laterality Date  . Tubal ligation  1991    bilateral  . Back surgery  2009  . Thyroidectomy    endometrial ablation Back sx x 2 (microdiscetomy and discectomy)  Family History  Problem Relation Age of Onset  . Ovarian cancer Mother   . Mental illness Mother   . Diabetes Mother   . Heart disease Maternal Grandfather   . Cancer Maternal Grandfather 76    colon  . Colon cancer Maternal Grandfather   . Cancer Maternal Aunt 42    blood cancer; unknown type  . Cancer Maternal Grandmother 58    breast  . Esophageal cancer Neg Hx   . Rectal cancer Neg Hx   . Stomach cancer Neg Hx     Social History:  reports that she quit smoking about 18 years ago. Her smoking use included Cigarettes. She has never used smokeless tobacco. She reports that she drinks alcohol. She reports that she does not use illicit drugs.  married  Allergies:  Allergies  Allergen Reactions  . Benzoin Dermatitis    Blisters, itching, redness, hot sensation with combo of Benzoin and steri strips  . Iodine Solution [Povidone Iodine]     rash  . Mouthwashes Other (See Comments)    D[employer]'s mouthwash irritated mouth.   Meds: Percocet ?muscle relaxer Synthroid 155mcg   Review of Systems  Constitutional: Negative.   HENT: Negative.   Eyes: Negative.   Respiratory: Negative.   Cardiovascular: Negative.   Gastrointestinal: Positive for abdominal pain.       Pelvic pain  Genitourinary: Negative.   Musculoskeletal: Negative.   Skin: Negative.   Neurological: Negative.   Psychiatric/Behavioral: Negative.     There were no vitals taken for this visit. Physical Exam  Constitutional: She is oriented to person, place, and time. She appears well-developed and well-nourished.  HENT:  Head: Normocephalic and atraumatic.  Cardiovascular: Normal rate and regular rhythm.   Respiratory: Effort normal and breath sounds normal. No respiratory distress. She has no wheezes.  GI: Soft. Bowel sounds are normal. She exhibits no distension. There is no tenderness.  Musculoskeletal: Normal range of motion.  Neurological: She is alert and oriented to person, place, and time.  Skin: Skin is warm and dry.  Psychiatric: She has a normal mood and affect. Her behavior is normal.    UrCx no growth .Hgb 14.1 Cr = 0.58 Nl LFTs 6/16 - TSH =  11  Assessment/Plan: 43WN [id number] with hematometra, AUB, pelvic pain for LAVH/BSO D/w pt r/b/a, voices understanding desires definitive mgmt Desires BSO for prophylaxis given FH of ovarian CA - voices understanding to risk of surgical menopause Ancef for surgical prophylaxis  Bovard-Stuckert, Rebecca Gallegos 05/05/2015, 8:41 PM

## 2015-05-06 ENCOUNTER — Observation Stay (HOSPITAL_COMMUNITY)
Admission: RE | Admit: 2015-05-06 | Discharge: 2015-05-07 | Disposition: A | Discharge status: Home / Self Care | Payer: Managed Care, Other (non HMO) | Source: Ambulatory Visit | Attending: Obstetrics and Gynecology | Admitting: Obstetrics and Gynecology

## 2015-05-06 ENCOUNTER — Encounter (HOSPITAL_COMMUNITY)
Admission: RE | Disposition: A | Discharge status: Home / Self Care | Payer: Self-pay | Source: Ambulatory Visit | Attending: Obstetrics and Gynecology

## 2015-05-06 ENCOUNTER — Ambulatory Visit (HOSPITAL_COMMUNITY): Payer: Managed Care, Other (non HMO) | Admitting: Anesthesiology

## 2015-05-06 ENCOUNTER — Encounter (HOSPITAL_COMMUNITY): Payer: Self-pay | Admitting: Obstetrics and Gynecology

## 2015-05-06 DIAGNOSIS — F419 Anxiety disorder, unspecified: Secondary | ICD-10-CM | POA: Diagnosis not present

## 2015-05-06 DIAGNOSIS — R102 Pelvic and perineal pain: Secondary | ICD-10-CM

## 2015-05-06 DIAGNOSIS — Z9889 Other specified postprocedural states: Secondary | ICD-10-CM

## 2015-05-06 DIAGNOSIS — N857 Hematometra: Principal | ICD-10-CM | POA: Diagnosis present

## 2015-05-06 DIAGNOSIS — Z90710 Acquired absence of both cervix and uterus: Secondary | ICD-10-CM

## 2015-05-06 DIAGNOSIS — Z883 Allergy status to other anti-infective agents status: Secondary | ICD-10-CM | POA: Insufficient documentation

## 2015-05-06 DIAGNOSIS — Z90722 Acquired absence of ovaries, bilateral: Secondary | ICD-10-CM

## 2015-05-06 DIAGNOSIS — N938 Other specified abnormal uterine and vaginal bleeding: Secondary | ICD-10-CM | POA: Diagnosis not present

## 2015-05-06 DIAGNOSIS — Z79899 Other long term (current) drug therapy: Secondary | ICD-10-CM | POA: Diagnosis not present

## 2015-05-06 DIAGNOSIS — Z87891 Personal history of nicotine dependence: Secondary | ICD-10-CM | POA: Insufficient documentation

## 2015-05-06 DIAGNOSIS — Z8041 Family history of malignant neoplasm of ovary: Secondary | ICD-10-CM

## 2015-05-06 DIAGNOSIS — N939 Abnormal uterine and vaginal bleeding, unspecified: Secondary | ICD-10-CM | POA: Insufficient documentation

## 2015-05-06 DIAGNOSIS — E039 Hypothyroidism, unspecified: Secondary | ICD-10-CM | POA: Diagnosis not present

## 2015-05-06 HISTORY — DX: Acquired absence of ovaries, bilateral: Z90.722

## 2015-05-06 HISTORY — DX: Family history of malignant neoplasm of ovary: Z80.41

## 2015-05-06 HISTORY — PX: LAPAROSCOPIC ASSISTED VAGINAL HYSTERECTOMY: SHX5398

## 2015-05-06 HISTORY — DX: Pelvic and perineal pain: R10.2

## 2015-05-06 HISTORY — DX: Hematometra: N85.7

## 2015-05-06 HISTORY — DX: Acquired absence of both cervix and uterus: Z90.710

## 2015-05-06 HISTORY — DX: Other specified postprocedural states: Z98.890

## 2015-05-06 HISTORY — PX: SALPINGOOPHORECTOMY: SHX82

## 2015-05-06 LAB — TYPE AND SCREEN
ABO/RH(D): A NEG
ANTIBODY SCREEN: NEGATIVE

## 2015-05-06 LAB — PREGNANCY, URINE: Preg Test, Ur: NEGATIVE

## 2015-05-06 LAB — ABO/RH: ABO/RH(D): A NEG

## 2015-05-06 SURGERY — HYSTERECTOMY, VAGINAL, LAPAROSCOPY-ASSISTED
Anesthesia: General | Site: Abdomen

## 2015-05-06 MED ORDER — SCOPOLAMINE 1 MG/3DAYS TD PT72
MEDICATED_PATCH | TRANSDERMAL | Status: AC
  Administered 2015-05-06: 1.5 mg via TRANSDERMAL
  Filled 2015-05-06: qty 1

## 2015-05-06 MED ORDER — PROPOFOL 10 MG/ML IV BOLUS
INTRAVENOUS | Status: AC
Start: 2015-05-06 — End: 2015-05-06
  Filled 2015-05-06: qty 20

## 2015-05-06 MED ORDER — SODIUM CHLORIDE 0.9 % IJ SOLN: 9.0000 mL | INTRAMUSCULAR | Status: DC | PRN

## 2015-05-06 MED ORDER — HEPARIN SODIUM (PORCINE) 5000 UNIT/ML IJ SOLN
INTRAMUSCULAR | Status: AC
  Filled 2015-05-06: qty 1

## 2015-05-06 MED ORDER — METHYLENE BLUE 1 % INJ SOLN
INTRAMUSCULAR | Status: AC
  Filled 2015-05-06: qty 1

## 2015-05-06 MED ORDER — ALUM & MAG HYDROXIDE-SIMETH 200-200-20 MG/5ML PO SUSP: 30.0000 mL | ORAL | Status: DC | PRN

## 2015-05-06 MED ORDER — PROPOFOL 10 MG/ML IV BOLUS
INTRAVENOUS | Status: DC | PRN
  Administered 2015-05-06: 200 mg via INTRAVENOUS

## 2015-05-06 MED ORDER — LIDOCAINE HCL (CARDIAC) 20 MG/ML IV SOLN
INTRAVENOUS | Status: AC
  Filled 2015-05-06: qty 5

## 2015-05-06 MED ORDER — HYDROMORPHONE HCL 1 MG/ML IJ SOLN
INTRAMUSCULAR | Status: DC | PRN
  Administered 2015-05-06 (×2): 1 mg via INTRAVENOUS

## 2015-05-06 MED ORDER — LACTATED RINGERS IV SOLN: INTRAVENOUS | Status: DC

## 2015-05-06 MED ORDER — MIDAZOLAM HCL 2 MG/2ML IJ SOLN
INTRAMUSCULAR | Status: AC
  Filled 2015-05-06: qty 2

## 2015-05-06 MED ORDER — ONDANSETRON HCL 4 MG/2ML IJ SOLN: 4.0000 mg | Freq: Four times a day (QID) | INTRAMUSCULAR | Status: DC | PRN

## 2015-05-06 MED ORDER — KETOROLAC TROMETHAMINE 30 MG/ML IJ SOLN
30.0000 mg | Freq: Three times a day (TID) | INTRAMUSCULAR | Status: DC
  Administered 2015-05-06 (×2): 30 mg via INTRAVENOUS
  Filled 2015-05-06: qty 1

## 2015-05-06 MED ORDER — ONDANSETRON HCL 4 MG/2ML IJ SOLN
INTRAMUSCULAR | Status: DC | PRN
  Administered 2015-05-06: 4 mg via INTRAVENOUS

## 2015-05-06 MED ORDER — KETOROLAC TROMETHAMINE 30 MG/ML IJ SOLN
INTRAMUSCULAR | Status: AC
  Filled 2015-05-06: qty 1

## 2015-05-06 MED ORDER — SIMETHICONE 80 MG PO CHEW: 80.0000 mg | CHEWABLE_TABLET | Freq: Four times a day (QID) | ORAL | Status: DC | PRN

## 2015-05-06 MED ORDER — FENTANYL CITRATE (PF) 250 MCG/5ML IJ SOLN
INTRAMUSCULAR | Status: AC
  Filled 2015-05-06: qty 5

## 2015-05-06 MED ORDER — BUPIVACAINE HCL (PF) 0.25 % IJ SOLN
INTRAMUSCULAR | Status: AC
  Filled 2015-05-06: qty 30

## 2015-05-06 MED ORDER — DIPHENHYDRAMINE HCL 12.5 MG/5ML PO ELIX: 12.5000 mg | ORAL_SOLUTION | Freq: Four times a day (QID) | ORAL | Status: DC | PRN

## 2015-05-06 MED ORDER — LACTATED RINGERS IR SOLN
Status: DC | PRN
  Administered 2015-05-06: 3000 mL

## 2015-05-06 MED ORDER — ONDANSETRON HCL 4 MG/2ML IJ SOLN
INTRAMUSCULAR | Status: AC
  Filled 2015-05-06: qty 2

## 2015-05-06 MED ORDER — ROCURONIUM BROMIDE 100 MG/10ML IV SOLN
INTRAVENOUS | Status: AC
  Filled 2015-05-06: qty 1

## 2015-05-06 MED ORDER — HYDROMORPHONE HCL 1 MG/ML IJ SOLN
INTRAMUSCULAR | Status: AC
  Filled 2015-05-06: qty 1

## 2015-05-06 MED ORDER — SODIUM CHLORIDE 0.9 % IJ SOLN
INTRAMUSCULAR | Status: AC
  Filled 2015-05-06: qty 100

## 2015-05-06 MED ORDER — SCOPOLAMINE 1 MG/3DAYS TD PT72
1.0000 | MEDICATED_PATCH | Freq: Once | TRANSDERMAL | Status: DC
  Administered 2015-05-06: 1.5 mg via TRANSDERMAL

## 2015-05-06 MED ORDER — MENTHOL 3 MG MT LOZG: 1.0000 | LOZENGE | OROMUCOSAL | Status: DC | PRN

## 2015-05-06 MED ORDER — VASOPRESSIN 20 UNIT/ML IV SOLN
INTRAVENOUS | Status: AC
  Filled 2015-05-06: qty 1

## 2015-05-06 MED ORDER — GUAIFENESIN 100 MG/5ML PO SOLN: 15.0000 mL | ORAL | Status: DC | PRN

## 2015-05-06 MED ORDER — LEVOTHYROXINE SODIUM 175 MCG PO TABS
175.0000 ug | ORAL_TABLET | Freq: Every day | ORAL | Status: DC
  Administered 2015-05-07: 175 ug via ORAL
  Filled 2015-05-06: qty 1

## 2015-05-06 MED ORDER — BUPIVACAINE HCL (PF) 0.25 % IJ SOLN
INTRAMUSCULAR | Status: DC | PRN
  Administered 2015-05-06: 11 mL

## 2015-05-06 MED ORDER — TRAMADOL HCL 50 MG PO TABS
50.0000 mg | ORAL_TABLET | Freq: Four times a day (QID) | ORAL | Status: DC | PRN
  Administered 2015-05-06: 50 mg via ORAL
  Filled 2015-05-06: qty 1

## 2015-05-06 MED ORDER — NALOXONE HCL 0.4 MG/ML IJ SOLN
0.4000 mg | INTRAMUSCULAR | Status: DC | PRN
Start: 2015-05-06 — End: 2015-05-07

## 2015-05-06 MED ORDER — LACTATED RINGERS IV SOLN
INTRAVENOUS | Status: DC
Start: 2015-05-06 — End: 2015-05-07
  Administered 2015-05-06: 13:00:00 via INTRAVENOUS

## 2015-05-06 MED ORDER — MORPHINE SULFATE 1 MG/ML IV SOLN
INTRAVENOUS | Status: DC
  Administered 2015-05-06 (×2): via INTRAVENOUS
  Administered 2015-05-06: 26.52 mg via INTRAVENOUS
  Administered 2015-05-06: 26 mg via INTRAVENOUS
  Filled 2015-05-06 (×2): qty 25

## 2015-05-06 MED ORDER — FENTANYL CITRATE (PF) 250 MCG/5ML IJ SOLN
INTRAMUSCULAR | Status: DC | PRN
  Administered 2015-05-06 (×3): 50 ug via INTRAVENOUS
  Administered 2015-05-06: 100 ug via INTRAVENOUS

## 2015-05-06 MED ORDER — ROCURONIUM BROMIDE 100 MG/10ML IV SOLN
INTRAVENOUS | Status: DC | PRN
  Administered 2015-05-06: 35 mg via INTRAVENOUS
  Administered 2015-05-06: 5 mg via INTRAVENOUS
  Administered 2015-05-06: 10 mg via INTRAVENOUS

## 2015-05-06 MED ORDER — NEOSTIGMINE METHYLSULFATE 10 MG/10ML IV SOLN
INTRAVENOUS | Status: DC | PRN
  Administered 2015-05-06: 2 mg via INTRAVENOUS

## 2015-05-06 MED ORDER — PROMETHAZINE HCL 25 MG/ML IJ SOLN
6.2500 mg | INTRAMUSCULAR | Status: DC | PRN
Start: 2015-05-06 — End: 2015-05-06

## 2015-05-06 MED ORDER — DEXAMETHASONE SODIUM PHOSPHATE 10 MG/ML IJ SOLN
INTRAMUSCULAR | Status: AC
  Filled 2015-05-06: qty 1

## 2015-05-06 MED ORDER — ONDANSETRON HCL 4 MG PO TABS: 4.0000 mg | ORAL_TABLET | Freq: Four times a day (QID) | ORAL | Status: DC | PRN

## 2015-05-06 MED ORDER — NEOSTIGMINE METHYLSULFATE 10 MG/10ML IV SOLN
INTRAVENOUS | Status: AC
  Filled 2015-05-06: qty 1

## 2015-05-06 MED ORDER — CEFAZOLIN SODIUM-DEXTROSE 2-3 GM-% IV SOLR
2.0000 g | INTRAVENOUS | Status: AC
  Administered 2015-05-06: 2 g via INTRAVENOUS

## 2015-05-06 MED ORDER — VASOPRESSIN 20 UNIT/ML IV SOLN
INTRAVENOUS | Status: DC | PRN
  Administered 2015-05-06: 8 mL via INTRAMUSCULAR

## 2015-05-06 MED ORDER — DIPHENHYDRAMINE HCL 50 MG/ML IJ SOLN
12.5000 mg | Freq: Four times a day (QID) | INTRAMUSCULAR | Status: DC | PRN
  Administered 2015-05-06: 12.5 mg via INTRAVENOUS

## 2015-05-06 MED ORDER — DEXAMETHASONE SODIUM PHOSPHATE 4 MG/ML IJ SOLN
INTRAMUSCULAR | Status: DC | PRN
  Administered 2015-05-06: 4 mg via INTRAVENOUS

## 2015-05-06 MED ORDER — FENTANYL CITRATE (PF) 100 MCG/2ML IJ SOLN
INTRAMUSCULAR | Status: AC
  Filled 2015-05-06: qty 2

## 2015-05-06 MED ORDER — LACTATED RINGERS IV SOLN
INTRAVENOUS | Status: DC
  Administered 2015-05-06 (×2): via INTRAVENOUS

## 2015-05-06 MED ORDER — FENTANYL CITRATE (PF) 100 MCG/2ML IJ SOLN
25.0000 ug | INTRAMUSCULAR | Status: DC | PRN
  Administered 2015-05-06 (×2): 50 ug via INTRAVENOUS

## 2015-05-06 MED ORDER — IBUPROFEN 800 MG PO TABS: 800.0000 mg | ORAL_TABLET | Freq: Three times a day (TID) | ORAL | Status: DC | PRN

## 2015-05-06 MED ORDER — STERILE WATER FOR IRRIGATION IR SOLN
Status: DC | PRN
  Administered 2015-05-06: 1000 mL

## 2015-05-06 MED ORDER — CEFAZOLIN SODIUM-DEXTROSE 2-3 GM-% IV SOLR
INTRAVENOUS | Status: AC
  Filled 2015-05-06: qty 50

## 2015-05-06 MED ORDER — DIPHENHYDRAMINE HCL 50 MG/ML IJ SOLN
INTRAMUSCULAR | Status: AC
  Filled 2015-05-06: qty 1

## 2015-05-06 MED ORDER — GLYCOPYRROLATE 0.2 MG/ML IJ SOLN
INTRAMUSCULAR | Status: DC | PRN
  Administered 2015-05-06: 0.1 mg via INTRAVENOUS
  Administered 2015-05-06: 0.4 mg via INTRAVENOUS

## 2015-05-06 MED ORDER — LIDOCAINE HCL (CARDIAC) 20 MG/ML IV SOLN
INTRAVENOUS | Status: DC | PRN
  Administered 2015-05-06: 80 mg via INTRAVENOUS

## 2015-05-06 MED ORDER — MIDAZOLAM HCL 2 MG/2ML IJ SOLN
INTRAMUSCULAR | Status: DC | PRN
  Administered 2015-05-06: 2 mg via INTRAVENOUS

## 2015-05-06 SURGICAL SUPPLY — 34 items
CABLE HIGH FREQUENCY MONO STRZ (ELECTRODE) IMPLANT
CLOTH BEACON ORANGE TIMEOUT ST (SAFETY) ×3 IMPLANT
CONT PATH 16OZ SNAP LID 3702 (MISCELLANEOUS) ×3 IMPLANT
COVER BACK TABLE 60X90IN (DRAPES) ×3 IMPLANT
DECANTER SPIKE VIAL GLASS SM (MISCELLANEOUS) ×1 IMPLANT
DRSG COVADERM PLUS 2X2 (GAUZE/BANDAGES/DRESSINGS) ×6 IMPLANT
DRSG OPSITE POSTOP 3X4 (GAUZE/BANDAGES/DRESSINGS) IMPLANT
DURAPREP 26ML APPLICATOR (WOUND CARE) ×3 IMPLANT
ELECT REM PT RETURN 9FT ADLT (ELECTROSURGICAL)
ELECTRODE REM PT RTRN 9FT ADLT (ELECTROSURGICAL) IMPLANT
EVACUATOR PREFILTER SMOKE (MISCELLANEOUS) ×3 IMPLANT
GLOVE BIO SURGEON STRL SZ 6.5 (GLOVE) ×3 IMPLANT
GLOVE BIOGEL PI IND STRL 7.0 (GLOVE) ×4 IMPLANT
GLOVE BIOGEL PI INDICATOR 7.0 (GLOVE) ×2
NEEDLE INSUFFLATION 120MM (ENDOMECHANICALS) ×3 IMPLANT
NS IRRIG 1000ML POUR BTL (IV SOLUTION) ×3 IMPLANT
PACK LAVH (CUSTOM PROCEDURE TRAY) ×3 IMPLANT
PACK ROBOTIC GOWN (GOWN DISPOSABLE) ×3 IMPLANT
PAD POSITIONER PINK NONSTERILE (MISCELLANEOUS) ×3 IMPLANT
SET CYSTO W/LG BORE CLAMP LF (SET/KITS/TRAYS/PACK) IMPLANT
SET IRRIG TUBING LAPAROSCOPIC (IRRIGATION / IRRIGATOR) ×1 IMPLANT
SHEARS HARMONIC ACE PLUS 36CM (ENDOMECHANICALS) ×3 IMPLANT
STRIP CLOSURE SKIN 1/4X3 (GAUZE/BANDAGES/DRESSINGS) IMPLANT
SUT VIC AB 1 CT1 18XBRD ANBCTR (SUTURE) ×4 IMPLANT
SUT VIC AB 1 CT1 8-18 (SUTURE) ×6
SUT VIC AB 2-0 CT1 (SUTURE) ×3 IMPLANT
SUT VICRYL 0 TIES 12 18 (SUTURE) ×3 IMPLANT
SUT VICRYL 0 UR6 27IN ABS (SUTURE) IMPLANT
SUT VICRYL 4-0 PS2 18IN ABS (SUTURE) ×3 IMPLANT
TOWEL OR 17X24 6PK STRL BLUE (TOWEL DISPOSABLE) ×6 IMPLANT
TRAY FOLEY CATH SILVER 14FR (SET/KITS/TRAYS/PACK) ×3 IMPLANT
TROCAR XCEL NON-BLD 5MMX100MML (ENDOMECHANICALS) ×9 IMPLANT
WARMER LAPAROSCOPE (MISCELLANEOUS) ×3 IMPLANT
WATER STERILE IRR 1000ML POUR (IV SOLUTION) ×3 IMPLANT

## 2015-05-06 NOTE — Progress Notes (Signed)
Day of Surgery Procedure(s) (LRB): LAPAROSCOPIC ASSISTED VAGINAL HYSTERECTOMY (N/A) SALPINGO OOPHORECTOMY (Bilateral)  Subjective: Patient reports tolerating PO.  Looks great, ambulated already and pain well-controlled  Objective: I have reviewed patient's vital signs and intake and output.  Great UOP  General: alert and cooperative GI: soft NT  Assessment: s/p Procedure(s): LAPAROSCOPIC ASSISTED VAGINAL HYSTERECTOMY (N/A) SALPINGO OOPHORECTOMY (Bilateral): progressing well  Plan: Advance diet  Doing great, if continues to feel this well will try to d/c foley and convert to po meds as evening progresses   Rebecca Gallegos W 05/06/2015, 6:21 PM

## 2015-05-06 NOTE — Brief Op Note (Signed)
05/06/2015  12:09 PM  PATIENT:  Rebecca Gallegos  49 y.o. female  PRE-OPERATIVE DIAGNOSIS:  Hematometra   POST-OPERATIVE DIAGNOSIS:  Hematometra  PROCEDURE:  Procedure(s): LAPAROSCOPIC ASSISTED VAGINAL HYSTERECTOMY (N/A) SALPINGO OOPHORECTOMY (Bilateral)  SURGEON:  Surgeon(s) and Role:    * Janyth Contes, MD - Primary    * Cheri Fowler, MD - Assisting  ANESTHESIA:   local and general  EBL:  Total I/O In: 1000 [I.V.:1000] Out: 550 [Urine:150; Blood:400]  BLOOD ADMINISTERED:none  DRAINS: Urinary Catheter (Foley)   LOCAL MEDICATIONS USED:  MARCAINE     SPECIMEN:  Source of Specimen:  uterus, cervix, B tubes and ovaries  DISPOSITION OF SPECIMEN:  PATHOLOGY  COUNTS:  YES  TOURNIQUET:  * No tourniquets in log *  DICTATION: .Other Dictation: Dictation Number [id number]  PLAN OF CARE: Admit for overnight observation  PATIENT DISPOSITION:  PACU - hemodynamically stable.   Delay start of Pharmacological VTE agent (>24hrs) due to surgical blood loss or risk of bleeding: not applicable

## 2015-05-06 NOTE — Transfer of Care (Signed)
Immediate Anesthesia Transfer of Care Note  Patient: Rebecca Gallegos  Procedure(s) Performed: Procedure(s): LAPAROSCOPIC ASSISTED VAGINAL HYSTERECTOMY (N/A) SALPINGO OOPHORECTOMY (Bilateral)  Patient Location: PACU  Anesthesia Type:General  Level of Consciousness: awake and alert   Airway & Oxygen Therapy: Patient Spontanous Breathing and Patient connected to nasal cannula oxygen  Post-op Assessment: Report given to RN and Post -op Vital signs reviewed and stable  Post vital signs: Reviewed and stable  Last Vitals:  Filed Vitals:   05/06/15 0758  BP: 129/79  Pulse: 62  Temp: 36.6 C  Resp: 18    Complications: No apparent anesthesia complications

## 2015-05-06 NOTE — Anesthesia Procedure Notes (Signed)
Procedure Name: Intubation Date/Time: 05/06/2015 9:37 AM Performed by: Flossie Dibble Pre-anesthesia Checklist: Patient identified, Timeout performed, Emergency Drugs available, Suction available and Patient being monitored Patient Re-evaluated:Patient Re-evaluated prior to inductionOxygen Delivery Method: Circle system utilized Preoxygenation: Pre-oxygenation with 100% oxygen Intubation Type: IV induction Ventilation: Mask ventilation without difficulty Grade View: Grade I Tube type: Oral Tube size: 7.0 mm Number of attempts: 1 Airway Equipment and Method: Video-laryngoscopy and Stylet (Elective Glide Scope intubation. Anterior, Right front tooth  capped.) Placement Confirmation: ETT inserted through vocal cords under direct vision,  positive ETCO2 and breath sounds checked- equal and bilateral Secured at: 21 cm Tube secured with: Tape Dental Injury: Teeth and Oropharynx as per pre-operative assessment  Difficulty Due To: Difficult Airway- due to anterior larynx

## 2015-05-06 NOTE — Anesthesia Postprocedure Evaluation (Signed)
  Anesthesia Post-op Note  Patient: Rebecca Gallegos  Procedure(s) Performed: Procedure(s): LAPAROSCOPIC ASSISTED VAGINAL HYSTERECTOMY (N/A) SALPINGO OOPHORECTOMY (Bilateral)  Patient Location: PACU  Anesthesia Type:General  Level of Consciousness: awake, alert  and oriented  Airway and Oxygen Therapy: Patient Spontanous Breathing  Post-op Pain: mild  Post-op Assessment: Post-op Vital signs reviewed              Post-op Vital Signs: Reviewed and stable  Last Vitals:  Filed Vitals:   05/06/15 1635  BP: 106/59  Pulse: 68  Temp: 37.1 C  Resp: 20    Complications: No apparent anesthesia complications

## 2015-05-06 NOTE — Op Note (Signed)
Family updated at 11:15 via [employer], regarding patient status. Rebecca Gallegos, [place] 05/06/2015 11:15 AM

## 2015-05-06 NOTE — Interval H&P Note (Signed)
History and Physical Interval Note:  05/06/2015 8:47 AM  Rebecca Gallegos  has presented today for surgery, with the diagnosis of Hematometra   The various methods of treatment have been discussed with the patient and family. After consideration of risks, benefits and other options for treatment, the patient has consented to  Procedure(s): LAPAROSCOPIC ASSISTED VAGINAL HYSTERECTOMY (N/A) SALPINGO OOPHORECTOMY (Bilateral) as a surgical intervention .  The patient's history has been reviewed, patient examined, no change in status, stable for surgery.  I have reviewed the patient's chart and labs.  Questions were answered to the patient's satisfaction.     Bovard-Stuckert, Brylan Seubert

## 2015-05-06 NOTE — Anesthesia Postprocedure Evaluation (Signed)
  Anesthesia Post-op Note  Patient: Rebecca Gallegos  Procedure(s) Performed: Procedure(s) (LRB): LAPAROSCOPIC ASSISTED VAGINAL HYSTERECTOMY (N/A) SALPINGO OOPHORECTOMY (Bilateral)  Patient Location: PACU  Anesthesia Type: General  Level of Consciousness: awake and alert   Airway and Oxygen Therapy: Patient Spontanous Breathing  Post-op Pain: mild  Post-op Assessment: Post-op Vital signs reviewed, Patient's Cardiovascular Status Stable, Respiratory Function Stable, Patent Airway and No signs of Nausea or vomiting  Last Vitals:  Filed Vitals:   05/06/15 1230  BP: 117/67  Pulse: 65  Temp:   Resp: 14    Post-op Vital Signs: stable   Complications: No apparent anesthesia complications

## 2015-05-07 DIAGNOSIS — N857 Hematometra: Secondary | ICD-10-CM | POA: Diagnosis not present

## 2015-05-07 LAB — CBC
HEMATOCRIT: 32.1 % — AB (ref 36.0–46.0)
Hemoglobin: 10.9 g/dL — ABNORMAL LOW (ref 12.0–15.0)
MCH: 29.5 pg (ref 26.0–34.0)
MCHC: 34 g/dL (ref 30.0–36.0)
MCV: 86.8 fL (ref 78.0–100.0)
PLATELETS: 190 10*3/uL (ref 150–400)
RBC: 3.7 MIL/uL — AB (ref 3.87–5.11)
RDW: 13.6 % (ref 11.5–15.5)
WBC: 9.8 10*3/uL (ref 4.0–10.5)

## 2015-05-07 LAB — BASIC METABOLIC PANEL
ANION GAP: 3 — AB (ref 5–15)
BUN: 7 mg/dL (ref 6–20)
CALCIUM: 8.4 mg/dL — AB (ref 8.9–10.3)
CO2: 31 mmol/L (ref 22–32)
Chloride: 103 mmol/L (ref 101–111)
Creatinine, Ser: 0.55 mg/dL (ref 0.44–1.00)
GFR calc Af Amer: >60 mL/min (ref >60)
GFR calc non Af Amer: >60 mL/min (ref >60)
Glucose, Bld: 116 mg/dL — ABNORMAL HIGH (ref 65–99)
Potassium: 4.5 mmol/L (ref 3.5–5.1)
Sodium: 137 mmol/L (ref 135–145)

## 2015-05-07 MED ORDER — IBUPROFEN 800 MG PO TABS: 800.0000 mg | ORAL_TABLET | Freq: Three times a day (TID) | ORAL | Status: DC | PRN

## 2015-05-07 MED ORDER — HYDROMORPHONE HCL 2 MG PO TABS: 2.0000 mg | ORAL_TABLET | Freq: Four times a day (QID) | ORAL | Status: DC | PRN

## 2015-05-07 MED ORDER — HYDROMORPHONE HCL 2 MG PO TABS
2.0000 mg | ORAL_TABLET | ORAL | Status: DC | PRN
  Administered 2015-05-07 (×3): 2 mg via ORAL
  Filled 2015-05-07 (×3): qty 1

## 2015-05-07 MED ORDER — ALUM & MAG HYDROXIDE-SIMETH 200-200-20 MG/5ML PO SUSP: 30.0000 mL | ORAL | Status: DC | PRN

## 2015-05-07 NOTE — Op Note (Signed)
Rebecca Gallegos, Rebecca Gallegos           ACCOUNT NO.:  [id number]  MEDICAL RECORD NO.:  [id number]  LOCATION:  5188                          FACILITY:  [facility]  PHYSICIAN:  Thornell Sartorius, MD        DATE OF BIRTH:  [birth year]  DATE OF PROCEDURE:  05/06/2015 DATE OF DISCHARGE:                              OPERATIVE REPORT   PREOPERATIVE DIAGNOSES:  Failed NovaSure, hematometra, pelvic pain, family history of ovarian cancer.  POSTOPERATIVE DIAGNOSES:  Failed NovaSure, hematometra, pelvic pain, family history of ovarian cancer.  PROCEDURE:  Laparoscopic-assisted vaginal hysterectomy, bilateral salpingo-oophorectomy.  SURGEON:  Thornell Sartorius, MD.  ASSISTANT:  Clarene Duke, M.D.  ANESTHESIA:  Local and general.  EBL:  Approximately 400 mL.  URINE OUTPUT:  150 mL of clear urine at the end of the procedure.  IV FLUIDS:  1000 mL.  PATHOLOGY:  Uterus, cervix, bilateral tubes and ovaries.  COMPLICATIONS:  None.  DESCRIPTION OF PROCEDURE:  After informed consent was reviewed with the patient and her husband, she was transported to the OR, placed on the table in a supine position.  General anesthesia was induced, found be adequate.  She was then prepped and draped in the normal fashion.  After being placed in the [place], the Foley catheter was sterilely placed.  A Hulka tenaculum was placed in her uterus after the open-sided speculum was removed.  After gown and gloves were changed, attention was turned to the abdominal portion of the case and approximately 1 cm infraumbilical incision was made.  Using a Veress needle, pneumoperitoneum was obtained - after a hanging drop test was passed.  After the pneumoperitoneum was obtained with direct visualization, the trocar was placed at the umbilicus 5 mm.  5 mm accessory ports were placed in both the left and right under direct visualization.  Using a blunt-tipped probe, it was noted that her left adnexa had some scar of peritoneum to  adnexal structures, this was excised with Harmonic Scalpel.  The tube and ovary were then excised using the Harmonic Scalpel.  The round ligament and cardinal ligaments were removed to the level of the bladder flap, the bladder flap was created. The right side was also excised in the same fashion.  Some bleeding was controlled with Harmonic Scalpel.  Attention was turned to the vaginal portion of the case.  Patulous cervix was injected with vasopressin outlined with Bovie cautery.  Attempt to enter anteriorly was not successful, we easily entered posteriorly.  The uterosacral ligaments were doubly ligated and held in a stepwise fashion.  The level of the uterine artery was also ligated.  The uterus was flipped.  The ovaries and tubes were delivered.  Additional small area of peritoneum was excised with Bovie cautery.  The uterus was delivered.  Several areas of bleeding along the cuff were controlled with 3-0 Vicryl as well as Bovie cautery.  The uterosacral ligaments were plicated, the held sutures were also tied together.  The vaginal cuff was closed with 2-0 Vicryl in a running, locked fashion.  Attention was turned to the abdominal portion of the case.  Gloves and gowns were changed.  The camera was reinserted and brief pelvic survey revealed some areas  of bleeding on the peritoneum, these were controlled with harmonic Scalpel.  The ports were removed under direct visualization.  The ports were closed with deep suture of 3-0 Vicryl and then Dermabond for the skin.  The patient tolerated procedure well.  Sponge, lap, and needle counts were correct x2 per the operating room staff.     Thornell Sartorius, MD     JB/MEDQ  D:  05/06/2015  T:  05/07/2015  Job:  [id number]

## 2015-05-07 NOTE — Progress Notes (Signed)
Discharge instructions reviewed with patient.  Patient states understanding of home care, medications, activity, signs/symptoms to report to MD and return MD office visit.  Patients significant other and family will assist with her care @ home.  No home  equipment needed, patient has prescriptions and all personal belongings.  Patient discharged per wheelchair in stable condition with staff without incident.  

## 2015-05-07 NOTE — Discharge Summary (Signed)
Physician Discharge Summary  Patient ID: Rebecca Gallegos MRN: [id number] DOB/AGE: 50/50/1967 50 y.o.  Admit date: 05/06/2015 Discharge date: 05/07/2015  Admission Diagnoses:  Discharge Diagnoses:  Principal Problem:   S/P laparoscopic assisted vaginal hysterectomy (LAVH) Active Problems:   S/P endometrial ablation   Family history of ovarian cancer   Female pelvic pain   Hematometra   S/P BSO (bilateral salpingo-oophorectomy)   Discharged Condition: good  Hospital Course: admitted for LAVH/BSO, underwent without complication.  Hgb and Cr stable on POD #1.  Discharged to home ambulating, voiding and pain controlled.  Consults: None  Significant Diagnostic Studies: labs: CBC, BMP  Treatments: analgesia: Dilaudid PCA and surgery: LAVH/BSO  Discharge Exam: Blood pressure 122/68, pulse 66, temperature 97.8 F (36.6 C), temperature source Oral, resp. rate 18, height 5\' 6"  (1.676 m), weight 85.276 kg (188 lb), SpO2 100 %. General appearance: alert and no distress Resp: clear to auscultation bilaterally Cardio: regular rate and rhythm GI: soft, non-tender; bowel sounds normal; no masses,  no organomegaly and Inc C/D/I  Ext: sym, nt  Disposition:   Discharge Instructions    Call MD for:  persistant nausea and vomiting    Complete by:  As directed      Call MD for:  redness, tenderness, or signs of infection (pain, swelling, redness, odor or green/yellow discharge around incision site)    Complete by:  As directed      Call MD for:  severe uncontrolled pain    Complete by:  As directed      Diet - low sodium heart healthy    Complete by:  As directed      Discharge instructions    Complete by:  As directed   Call [phone] with questions or problems     Driving Restrictions    Complete by:  As directed   While taking strong pain medicine     Increase activity slowly    Complete by:  As directed      Lifting restrictions    Complete by:  As directed   No greater  than 10-15 lbs for 6 weeks     May shower / Bathe    Complete by:  As directed      May walk up steps    Complete by:  As directed      Sexual Activity Restrictions    Complete by:  As directed   Pelvic rest - no douching, tampons or sex for 6 weeks            Medication List    STOP taking these medications        ferrous sulfate 325 (65 FE) MG tablet     NAPROXEN PO      TAKE these medications        ALPRAZolam 0.25 MG tablet  Commonly known as:  XANAX  TAKE 1 TABLET BY MOUTH EVERY DAY AS NEEDED     alum & mag hydroxide-simeth 200-200-20 MG/5ML suspension  Commonly known as:  MAALOX/MYLANTA  Take 30 mLs by mouth every 4 (four) hours as needed for indigestion.     flurandrenolide 0.05 % lotion  Commonly known as:  CORDRAN  Apply 1 application topically 2 (two) times daily.     HYDROmorphone 2 MG tablet  Commonly known as:  DILAUDID  Take 1-2 tablets (2-4 mg total) by mouth every 6 (six) hours as needed for severe pain.     ibuprofen 800 MG tablet  Commonly known as:  ADVIL,MOTRIN  Take 1 tablet (800 mg total) by mouth every 8 (eight) hours as needed (mild pain).     levothyroxine 175 MCG tablet  Commonly known as:  SYNTHROID, LEVOTHROID  Take 1 tablet (175 mcg total) by mouth daily before breakfast.     MULTIVITAMIN ADULT PO  Take 1 tablet by mouth daily.           Follow-up Information    Follow up with Bovard-Stuckert, Kaida Games, MD. Schedule an appointment as soon as possible for a visit in 2 weeks.   Specialty:  Obstetrics and Gynecology   Why:  for postop check - incision check, 6 weeks for fill postop check   Contact information:   [address] [place] [address] [phone]       Signed: Janyth Contes 05/07/2015, 9:06 AM

## 2015-05-07 NOTE — Progress Notes (Signed)
1 Day Post-Op Procedure(s) (LRB): LAPAROSCOPIC ASSISTED VAGINAL HYSTERECTOMY (N/A) SALPINGO OOPHORECTOMY (Bilateral)  Subjective: Patient reports incisional pain, tolerating PO and no problems voiding.  Pain controlled with po dilaudid.      Objective: I have reviewed patient's vital signs, intake and output, medications and labs.  General: alert and no distress Resp: clear to auscultation bilaterally Cardio: regular rate and rhythm GI: soft, non-tender; bowel sounds normal; no masses,  no organomegaly and incision: clean, dry and intact Extremities: extremities normal, atraumatic, no cyanosis or edema  Assessment: s/p Procedure(s): LAPAROSCOPIC ASSISTED VAGINAL HYSTERECTOMY (N/A) SALPINGO OOPHORECTOMY (Bilateral): stable, progressing well and tolerating diet  Plan: Encourage ambulation Discontinue IV fluids Discharge home  Discharge with Motrin and PO dilaudid.  F/u 2 weeks.       Bovard-Stuckert, Tia Gelb 05/07/2015, 8:56 AM

## 2015-05-07 NOTE — Plan of Care (Signed)
Problem: Discharge Progression Outcomes Goal: Barriers To Progression Addressed/Resolved Outcome: Completed/Met Date Met:  05/07/15 Voiding large amount of urine.    Goal: Activity appropriate for discharge plan Outcome: Completed/Met Date Met:  05/07/15 Walks in halls frequently and tolerates well.

## 2015-05-07 NOTE — Plan of Care (Signed)
Problem: Phase II Progression Outcomes Goal: Progress activity as tolerated unless otherwise ordered Outcome: Completed/Met Date Met:  05/07/15 Has walked in hall several times and tolerated well. Goal: Incision/dressings dry and intact Outcome: Completed/Met Date Met:  05/07/15 Lap sites x 3 all CDI

## 2015-05-09 ENCOUNTER — Encounter (HOSPITAL_COMMUNITY): Payer: Self-pay | Admitting: Obstetrics and Gynecology

## 2015-05-09 NOTE — Addendum Note (Signed)
Addendum  created 05/09/15 1118 by Jonna Munro, CRNA   Modules edited: Charges VN

## 2015-05-30 ENCOUNTER — Encounter: Payer: Self-pay | Admitting: Internal Medicine

## 2015-05-30 ENCOUNTER — Ambulatory Visit (INDEPENDENT_AMBULATORY_CARE_PROVIDER_SITE_OTHER): Payer: Managed Care, Other (non HMO) | Admitting: Internal Medicine

## 2015-05-30 VITALS — BP 115/75 | HR 63 | Temp 97.9°F | Ht 66.5 in | Wt 184.2 lb

## 2015-05-30 DIAGNOSIS — Z8041 Family history of malignant neoplasm of ovary: Secondary | ICD-10-CM

## 2015-05-30 DIAGNOSIS — F419 Anxiety disorder, unspecified: Secondary | ICD-10-CM | POA: Diagnosis not present

## 2015-05-30 DIAGNOSIS — E039 Hypothyroidism, unspecified: Secondary | ICD-10-CM

## 2015-05-30 DIAGNOSIS — D649 Anemia, unspecified: Secondary | ICD-10-CM

## 2015-05-30 DIAGNOSIS — Z800 Family history of malignant neoplasm of digestive organs: Secondary | ICD-10-CM

## 2015-05-30 DIAGNOSIS — Z0000 Encounter for general adult medical examination without abnormal findings: Secondary | ICD-10-CM | POA: Diagnosis not present

## 2015-05-30 DIAGNOSIS — Z803 Family history of malignant neoplasm of breast: Secondary | ICD-10-CM

## 2015-05-30 LAB — CBC WITH DIFFERENTIAL/PLATELET
Basophils Absolute: 0 10*3/uL (ref 0.0–0.1)
Basophils Relative: 0.8 % (ref 0.0–3.0)
Eosinophils Absolute: 0.1 10*3/uL (ref 0.0–0.7)
Eosinophils Relative: 2.3 % (ref 0.0–5.0)
HCT: 43.1 % (ref 36.0–46.0)
HEMOGLOBIN: 14.4 g/dL (ref 12.0–15.0)
LYMPHS PCT: 31.6 % (ref 12.0–46.0)
Lymphs Abs: 1.7 10*3/uL (ref 0.7–4.0)
MCHC: 33.4 g/dL (ref 30.0–36.0)
MCV: 88 fl (ref 78.0–100.0)
MONO ABS: 0.4 10*3/uL (ref 0.1–1.0)
Monocytes Relative: 7.2 % (ref 3.0–12.0)
NEUTROS PCT: 58.1 % (ref 43.0–77.0)
Neutro Abs: 3.2 10*3/uL (ref 1.4–7.7)
PLATELETS: 257 10*3/uL (ref 150.0–400.0)
RBC: 4.9 Mil/uL (ref 3.87–5.11)
RDW: 13.1 % (ref 11.5–15.5)
WBC: 5.5 10*3/uL (ref 4.0–10.5)

## 2015-05-30 LAB — IBC PANEL
Iron: 82 ug/dL (ref 42–145)
SATURATION RATIOS: 24.8 % (ref 20.0–50.0)
TRANSFERRIN: 236 mg/dL (ref 212.0–360.0)

## 2015-05-30 LAB — TSH: TSH: 0.11 u[IU]/mL — ABNORMAL LOW (ref 0.35–4.50)

## 2015-05-30 LAB — FERRITIN: FERRITIN: 24.3 ng/mL (ref 10.0–291.0)

## 2015-05-30 NOTE — Progress Notes (Signed)
Pre visit review using our clinic review tool, if applicable. No additional management support is needed unless otherwise documented below in the visit note. 

## 2015-05-30 NOTE — Assessment & Plan Note (Signed)
Colonoscopy 01/2015 - sessile polyp in the transverse colon.

## 2015-05-30 NOTE — Progress Notes (Signed)
Patient ID: Rebecca Gallegos, female   DOB: 02-18-1966, 49 y.o.   MRN: [id number]   Subjective:    Patient ID: Rebecca Gallegos, female    DOB: [birth year], 49 y.o.   MRN: [id number]  HPI  Patient here to follow up on her current medical issues as well as for a complete physical exam.  She is now getting her pelvic/paps through gyn.  Just had hysterectomy and BSO.  Feels better.  Had a lot of adhesions.  Bowels are moving.  Still with some abdominal discomfort - with bowel movements.   Plans to discuss with gyn.  Eating and drinking well.  No nausea or vomiting.     Past Medical History  Diagnosis Date  . Hypothyroidism   . Anemia   . Anxiety     hx- has gone away since thyroid level controlled  . S/P endometrial ablation 04/06/2015  . Family history of ovarian cancer 04/06/2015  . Female pelvic pain 04/06/2015  . Hematometra 05/05/2015  . S/P laparoscopic assisted vaginal hysterectomy (LAVH) 05/06/2015  . S/P BSO (bilateral salpingo-oophorectomy) 05/06/2015    Outpatient Encounter Prescriptions as of 05/30/2015  Medication Sig  . levothyroxine (SYNTHROID, LEVOTHROID) 175 MCG tablet Take 1 tablet (175 mcg total) by mouth daily before breakfast. (Patient taking differently: Take 175 mcg by mouth daily before breakfast. Synthroid brand usage)  . ALPRAZolam (XANAX) 0.25 MG tablet TAKE 1 TABLET BY MOUTH EVERY DAY AS NEEDED (Patient not taking: Reported on 04/12/2015)  . alum & mag hydroxide-simeth (MAALOX/MYLANTA) 200-200-20 MG/5ML suspension Take 30 mLs by mouth every 4 (four) hours as needed for indigestion. (Patient not taking: Reported on 05/30/2015)  . flurandrenolide (CORDRAN) 0.05 % lotion Apply 1 application topically 2 (two) times daily.  Marland Kitchen HYDROmorphone (DILAUDID) 2 MG tablet Take 1-2 tablets (2-4 mg total) by mouth every 6 (six) hours as needed for severe pain. (Patient not taking: Reported on 05/30/2015)  . ibuprofen (ADVIL,MOTRIN) 800 MG tablet Take 1 tablet (800 mg total) by mouth  every 8 (eight) hours as needed (mild pain). (Patient not taking: Reported on 05/30/2015)  . Multiple Vitamins-Minerals (MULTIVITAMIN ADULT PO) Take 1 tablet by mouth daily.    No facility-administered encounter medications on file as of 05/30/2015.    Review of Systems  Constitutional: Negative for appetite change and unexpected weight change.  HENT: Negative for congestion and sinus pressure.   Respiratory: Negative for cough, chest tightness and shortness of breath.   Cardiovascular: Negative for chest pain, palpitations and leg swelling.  Gastrointestinal: Negative for nausea, vomiting, abdominal pain and diarrhea.  Genitourinary: Negative for dysuria and difficulty urinating.  Skin: Negative for color change and rash.  Neurological: Negative for dizziness, light-headedness and headaches.  Psychiatric/Behavioral: Negative for dysphoric mood and agitation.       Objective:    Physical Exam  Constitutional: She appears well-developed and well-nourished. No distress.  HENT:  Nose: Nose normal.  Mouth/Throat: Oropharynx is clear and moist.  Eyes: Conjunctivae are normal. Right eye exhibits no discharge. Left eye exhibits no discharge.  Neck: Neck supple. No thyromegaly present.  Cardiovascular: Normal rate and regular rhythm.   Pulmonary/Chest: Breath sounds normal. No respiratory distress. She has no wheezes.  Abdominal: Soft. Bowel sounds are normal. There is no tenderness.  Musculoskeletal: She exhibits no edema or tenderness.  Lymphadenopathy:    She has no cervical adenopathy.  Skin: No rash noted. No erythema.  Psychiatric: She has a normal mood and affect. Her behavior is normal.  BP 115/75 mmHg  Pulse 63  Temp(Src) 97.9 F (36.6 C) (Oral)  Ht 5' 6.5" (1.689 m)  Wt 184 lb 4 oz (83.575 kg)  BMI 29.30 kg/m2  SpO2 99%  LMP 01/18/2014 Wt Readings from Last 3 Encounters:  05/30/15 184 lb 4 oz (83.575 kg)  05/06/15 188 lb (85.276 kg)  04/21/15 188 lb (85.276 kg)      Lab Results  Component Value Date   WBC 5.5 05/30/2015   HGB 14.4 05/30/2015   HCT 43.1 05/30/2015   PLT 257.0 05/30/2015   GLUCOSE 116* 05/07/2015   CHOL 175 05/25/2014   TRIG 100.0 05/25/2014   HDL 57.90 05/25/2014   LDLCALC 97 05/25/2014   ALT 34 04/21/2015   AST 26 04/21/2015   NA 137 05/07/2015   K 4.5 05/07/2015   CL 103 05/07/2015   CREATININE 0.55 05/07/2015   BUN 7 05/07/2015   CO2 31 05/07/2015   TSH 0.11* 05/30/2015   INR 0.97 01/04/2010       Assessment & Plan:   Problem List Items Addressed This Visit    Anemia    Has a history of iron deficient anemia.  Recheck cbc and iron studies today.        Relevant Orders   CBC with Differential/Platelet (Completed)   Ferritin (Completed)   IBC panel (Completed)   Anxiety    Doing better.  Has xanax if needed.        Family history of malignant neoplasm of gastrointestinal tract    Colonoscopy 01/2015 - sessile polyp in the transverse colon.        Family history of malignant neoplasm of breast    Mammogram 08/02/14 - Birads I.  She will schedule f/u mammogram at her gyn appt.        Family history of ovarian cancer    Is s/p ovary removal.  Doing well.        Hypothyroidism - Primary    Recently adjusted medication.  Recheck tsh today.  She request referral to endocrinology.        Relevant Orders   TSH (Completed)   Ambulatory referral to Endocrinology     I spent 25 minutes with the patient and more than 50% of the time was spent in consultation regarding the above.     Einar Pheasant, MD

## 2015-05-30 NOTE — Assessment & Plan Note (Addendum)
Recently adjusted medication.  Recheck tsh today.  She request referral to endocrinology.

## 2015-05-30 NOTE — Assessment & Plan Note (Signed)
Mammogram 08/02/14 - Birads I.  She will schedule f/u mammogram at her gyn appt.

## 2015-05-30 NOTE — Assessment & Plan Note (Signed)
Doing better.  Has xanax if needed.

## 2015-05-30 NOTE — Assessment & Plan Note (Signed)
Has a history of iron deficient anemia.  Recheck cbc and iron studies today.

## 2015-05-30 NOTE — Assessment & Plan Note (Signed)
Is s/p ovary removal.  Doing well.

## 2015-05-31 ENCOUNTER — Other Ambulatory Visit: Payer: Self-pay | Admitting: Internal Medicine

## 2015-05-31 DIAGNOSIS — E039 Hypothyroidism, unspecified: Secondary | ICD-10-CM

## 2015-05-31 NOTE — Progress Notes (Signed)
Order placed for f/u tsh.  

## 2015-06-01 ENCOUNTER — Encounter: Payer: Self-pay | Admitting: Internal Medicine

## 2015-06-02 NOTE — Telephone Encounter (Signed)
I called pt & she would like to go to the [facility] in [place]. Pt notified to call their office today to schedule appt. Orders already in Epic for TSH.

## 2015-06-04 ENCOUNTER — Encounter: Payer: Self-pay | Admitting: Internal Medicine

## 2015-07-12 ENCOUNTER — Other Ambulatory Visit: Payer: Managed Care, Other (non HMO)

## 2015-08-01 ENCOUNTER — Other Ambulatory Visit (INDEPENDENT_AMBULATORY_CARE_PROVIDER_SITE_OTHER): Payer: Managed Care, Other (non HMO)

## 2015-08-01 DIAGNOSIS — E039 Hypothyroidism, unspecified: Secondary | ICD-10-CM | POA: Diagnosis not present

## 2015-08-01 LAB — TSH: TSH: 0.33 u[IU]/mL — ABNORMAL LOW (ref 0.35–4.50)

## 2015-08-02 ENCOUNTER — Encounter: Payer: Self-pay | Admitting: Internal Medicine

## 2015-08-04 ENCOUNTER — Other Ambulatory Visit: Payer: Self-pay | Admitting: *Deleted

## 2015-08-04 MED ORDER — LEVOTHYROXINE SODIUM 125 MCG PO TABS: 125.0000 ug | ORAL_TABLET | Freq: Every day | ORAL | Status: DC

## 2015-08-04 NOTE — Telephone Encounter (Signed)
Spoke with patient over the phone & results given. Pt was taking 15mcg of Synthroid. Per Dr. Nicki Reaper request, I have sent in 127mcg to [facility] in [place]. & mailed her next lab appt to recheck TSH in 6 wks. Pt requested to have labs drawn @ [facility] lab.

## 2015-08-29 ENCOUNTER — Other Ambulatory Visit: Payer: Self-pay | Admitting: Gynecology

## 2015-08-29 DIAGNOSIS — R928 Other abnormal and inconclusive findings on diagnostic imaging of breast: Secondary | ICD-10-CM

## 2015-09-09 ENCOUNTER — Ambulatory Visit
Admission: RE | Admit: 2015-09-09 | Discharge: 2015-09-09 | Disposition: A | Discharge status: Home / Self Care | Payer: Managed Care, Other (non HMO) | Source: Ambulatory Visit | Attending: Gynecology | Admitting: Gynecology

## 2015-09-09 DIAGNOSIS — R928 Other abnormal and inconclusive findings on diagnostic imaging of breast: Secondary | ICD-10-CM

## 2015-09-13 ENCOUNTER — Other Ambulatory Visit: Payer: Managed Care, Other (non HMO)

## 2015-09-13 ENCOUNTER — Other Ambulatory Visit: Payer: Self-pay | Admitting: Internal Medicine

## 2015-09-13 DIAGNOSIS — E039 Hypothyroidism, unspecified: Secondary | ICD-10-CM

## 2015-09-13 NOTE — Progress Notes (Signed)
Laced for tsh.

## 2015-09-19 ENCOUNTER — Other Ambulatory Visit (INDEPENDENT_AMBULATORY_CARE_PROVIDER_SITE_OTHER): Payer: Managed Care, Other (non HMO)

## 2015-09-19 DIAGNOSIS — E039 Hypothyroidism, unspecified: Secondary | ICD-10-CM | POA: Diagnosis not present

## 2015-09-19 LAB — TSH: TSH: 1.19 u[IU]/mL (ref 0.35–4.50)

## 2015-09-21 ENCOUNTER — Other Ambulatory Visit: Payer: Self-pay | Admitting: Internal Medicine

## 2015-09-21 ENCOUNTER — Encounter: Payer: Self-pay | Admitting: *Deleted

## 2015-09-21 DIAGNOSIS — E039 Hypothyroidism, unspecified: Secondary | ICD-10-CM

## 2015-09-21 NOTE — Progress Notes (Signed)
Order placed for f/u lab.   

## 2015-10-08 ENCOUNTER — Emergency Department
Admission: EM | Admit: 2015-10-08 | Discharge: 2015-10-08 | Payer: Managed Care, Other (non HMO) | Source: Home / Self Care | Attending: Family Medicine | Admitting: Family Medicine

## 2015-10-08 ENCOUNTER — Encounter: Payer: Self-pay | Admitting: *Deleted

## 2015-10-08 ENCOUNTER — Encounter (HOSPITAL_BASED_OUTPATIENT_CLINIC_OR_DEPARTMENT_OTHER): Payer: Self-pay | Admitting: Emergency Medicine

## 2015-10-08 ENCOUNTER — Emergency Department (HOSPITAL_BASED_OUTPATIENT_CLINIC_OR_DEPARTMENT_OTHER)
Admission: EM | Admit: 2015-10-08 | Discharge: 2015-10-08 | Disposition: A | Discharge status: Home / Self Care | Payer: Managed Care, Other (non HMO) | Attending: Emergency Medicine | Admitting: Emergency Medicine

## 2015-10-08 DIAGNOSIS — Z87891 Personal history of nicotine dependence: Secondary | ICD-10-CM | POA: Diagnosis not present

## 2015-10-08 DIAGNOSIS — F419 Anxiety disorder, unspecified: Secondary | ICD-10-CM | POA: Insufficient documentation

## 2015-10-08 DIAGNOSIS — Z79899 Other long term (current) drug therapy: Secondary | ICD-10-CM | POA: Diagnosis not present

## 2015-10-08 DIAGNOSIS — Z862 Personal history of diseases of the blood and blood-forming organs and certain disorders involving the immune mechanism: Secondary | ICD-10-CM | POA: Insufficient documentation

## 2015-10-08 DIAGNOSIS — Z7952 Long term (current) use of systemic steroids: Secondary | ICD-10-CM | POA: Insufficient documentation

## 2015-10-08 DIAGNOSIS — Z8742 Personal history of other diseases of the female genital tract: Secondary | ICD-10-CM | POA: Insufficient documentation

## 2015-10-08 DIAGNOSIS — E039 Hypothyroidism, unspecified: Secondary | ICD-10-CM | POA: Insufficient documentation

## 2015-10-08 DIAGNOSIS — H5711 Ocular pain, right eye: Secondary | ICD-10-CM | POA: Insufficient documentation

## 2015-10-08 DIAGNOSIS — H578 Other specified disorders of eye and adnexa: Secondary | ICD-10-CM | POA: Diagnosis not present

## 2015-10-08 MED ORDER — KETOROLAC TROMETHAMINE 0.5 % OP SOLN
1.0000 [drp] | Freq: Four times a day (QID) | OPHTHALMIC | Status: DC
Start: 2015-10-08 — End: 2015-11-02

## 2015-10-08 MED ORDER — TETRACAINE HCL 0.5 % OP SOLN
1.0000 [drp] | Freq: Once | OPHTHALMIC | Status: AC
  Administered 2015-10-08: 1 [drp] via OPHTHALMIC
  Filled 2015-10-08: qty 4

## 2015-10-08 MED ORDER — CIPROFLOXACIN HCL 0.3 % OP SOLN: OPHTHALMIC | Status: DC

## 2015-10-08 NOTE — ED Provider Notes (Signed)
CSN: [id number]     Arrival date & time 10/08/15  1225 History   First MD Initiated Contact with Patient 10/08/15 1241     Chief Complaint  Patient presents with  . Eye Drainage   (Consider location/radiation/quality/duration/timing/severity/associated sxs/prior Treatment) HPI  Pt is a 49yo female presenting to [facility] with c/o sudden onset Right eye pressure and thick discharge yesterday.  Pt states she has not noticed any discharge today but she still has aching pressure in her Right eye and blurred vision.  Pressure is mild to moderate in severity, worse with exposure to light. Pt does wear glasses most of the time as her vision is poor but states she wore contacts last week for about 2 hours while she was at a funeral. Denies cough, congestion, ear pain or sore throat. Denies headaches. Denies eye trauma. No sick contacts. No hx of similar symptoms. No hx of eye surgeries.   Past Medical History  Diagnosis Date  . Hypothyroidism   . Anemia   . Anxiety     hx- has gone away since thyroid level controlled  . S/P endometrial ablation 04/06/2015  . Family history of ovarian cancer 04/06/2015  . Female pelvic pain 04/06/2015  . Hematometra 05/05/2015  . S/P laparoscopic assisted vaginal hysterectomy (LAVH) 05/06/2015  . S/P BSO (bilateral salpingo-oophorectomy) 05/06/2015   Past Surgical History  Procedure Laterality Date  . Tubal ligation  1991    bilateral  . Back surgery  2009  . Thyroidectomy    . Laparoscopic assisted vaginal hysterectomy N/A 05/06/2015    Procedure: LAPAROSCOPIC ASSISTED VAGINAL HYSTERECTOMY;  Surgeon: Janyth Contes, MD;  Location: [facility];  Service: Gynecology;  Laterality: N/A;  . Salpingoophorectomy Bilateral 05/06/2015    Procedure: SALPINGO OOPHORECTOMY;  Surgeon: Janyth Contes, MD;  Location: [facility];  Service: Gynecology;  Laterality: Bilateral;   Family History  Problem Relation Age of Onset  . Ovarian cancer Mother   . Mental illness Mother   .  Diabetes Mother   . Heart disease Maternal Grandfather   . Cancer Maternal Grandfather 44    colon  . Colon cancer Maternal Grandfather   . Cancer Maternal Aunt 41    blood cancer; unknown type  . Cancer Maternal Grandmother 60    breast  . Esophageal cancer Neg Hx   . Rectal cancer Neg Hx   . Stomach cancer Neg Hx    Social History  Substance Use Topics  . Smoking status: Former Smoker    Types: Cigarettes    Quit date: 10/22/1996  . Smokeless tobacco: Never Used  . Alcohol Use: 0.0 oz/week    0 Standard drinks or equivalent per week     Comment: daily- wine   OB History    No data available     Review of Systems  Constitutional: Negative for fever and chills.  HENT: Negative for congestion, ear pain, sore throat, trouble swallowing and voice change.   Respiratory: Negative for cough and shortness of breath.   Cardiovascular: Negative for chest pain and palpitations.  Gastrointestinal: Negative for nausea, vomiting, abdominal pain and diarrhea.  Musculoskeletal: Negative for myalgias, back pain and arthralgias.  Skin: Negative for rash.  All other systems reviewed and are negative.   Allergies  Benzoin; Iodine solution; Oxycodone; and Mouthwashes  Home Medications   Prior to Admission medications   Medication Sig Start Date End Date Taking? Authorizing Provider  ALPRAZolam (XANAX) 0.25 MG tablet TAKE 1 TABLET BY MOUTH EVERY DAY AS NEEDED Patient  not taking: Reported on 04/12/2015    Einar Pheasant, MD  alum & mag hydroxide-simeth (MAALOX/MYLANTA) 200-200-20 MG/5ML suspension Take 30 mLs by mouth every 4 (four) hours as needed for indigestion. Patient not taking: Reported on 05/30/2015 05/07/15   Janyth Contes, MD  flurandrenolide (CORDRAN) 0.05 % lotion Apply 1 application topically 2 (two) times daily.    Historical Provider, MD  HYDROmorphone (DILAUDID) 2 MG tablet Take 1-2 tablets (2-4 mg total) by mouth every 6 (six) hours as needed for severe pain. Patient  not taking: Reported on 05/30/2015 05/07/15   Janyth Contes, MD  ibuprofen (ADVIL,MOTRIN) 800 MG tablet Take 1 tablet (800 mg total) by mouth every 8 (eight) hours as needed (mild pain). Patient not taking: Reported on 05/30/2015 05/07/15   Janyth Contes, MD  levothyroxine (SYNTHROID) 125 MCG tablet Take 1 tablet (125 mcg total) by mouth daily before breakfast. 08/04/15   Einar Pheasant, MD  levothyroxine (SYNTHROID, LEVOTHROID) 125 MCG tablet Take 1 tablet (125 mcg total) by mouth daily before breakfast. 08/04/15   Einar Pheasant, MD  Multiple Vitamins-Minerals (MULTIVITAMIN ADULT PO) Take 1 tablet by mouth daily.     Historical Provider, MD   Meds Ordered and Administered this Visit  Medications - No data to display  BP 128/82 mmHg  Pulse 75  Temp(Src) 97.6 F (36.4 C) (Oral)  Ht 5\' 6"  (1.676 m)  Wt 186 lb (84.369 kg)  BMI 30.04 kg/m2  SpO2 98%  LMP 01/18/2014 No data found.   Physical Exam  Constitutional: She is oriented to person, place, and time. She appears well-developed and well-nourished.  HENT:  Head: Normocephalic and atraumatic.  Right Ear: Hearing, tympanic membrane, external ear and ear canal normal.  Left Ear: Hearing, tympanic membrane, external ear and ear canal normal.  Nose: Nose normal.  Mouth/Throat: Uvula is midline, oropharynx is clear and moist and mucous membranes are normal.  No temporal artery tenderness.   Eyes: Conjunctivae, EOM and lids are normal. Pupils are equal, round, and reactive to light. Lids are everted and swept, no foreign bodies found. Right eye exhibits no discharge and no hordeolum. No foreign body present in the right eye. Left eye exhibits no discharge and no hordeolum. No foreign body present in the left eye. Right conjunctiva is not injected. Right conjunctiva has no hemorrhage. Right eye exhibits normal extraocular motion and no nystagmus. Left eye exhibits normal extraocular motion and no nystagmus.  Normal eye exam. PERRLA.  No fluorescein uptake.   Neck: Normal range of motion.  Cardiovascular: Normal rate.   Pulmonary/Chest: Effort normal.  Musculoskeletal: Normal range of motion.  Neurological: She is alert and oriented to person, place, and time. No cranial nerve deficit. Coordination normal.  Skin: Skin is warm and dry.  Psychiatric: She has a normal mood and affect. Her behavior is normal.  Nursing note and vitals reviewed.   ED Course  Procedures (including critical care time)  Labs Review Labs Reviewed - No data to display  Imaging Review No results found.   Visual Acuity Review  Right Eye Distance: 20/40 Left Eye Distance: 20/20 Bilateral Distance: 20/25   MDM   1. Acute right eye pain     Pt c/o Right eye pressure and discharge that started yesterday. No evidence of erythema or discharge from eye today but pt still c/o eye pressure and photophobia.  No evidence of conjunctivitis at this time.    Pt notes worse vision in her Right eye than normal.  No fluorescein uptake on  exam.    Consulted with Dr. Assunta Found who recommended pt go to emergency department for further evaluation of eye pressure as necessary instruments are not available in urgent care setting.   Discussed with pt who agreed to go to [facility] for further evaluation. Pt's husband accompanied pt and will drive her to [facility].  Pt in stable condition for POV.     Noland Fordyce, PA-C 10/08/15 1322

## 2015-10-08 NOTE — ED Provider Notes (Signed)
CSN: [id number]     Arrival date & time 10/08/15  1316 History   First MD Initiated Contact with Patient 10/08/15 1336     Chief Complaint  Patient presents with  . Eye Pain     (Consider location/radiation/quality/duration/timing/severity/associated sxs/prior Treatment) The history is provided by the patient and medical records.    49 year old female with history of hypothyroidism, anemia, presenting to the ED for right eye pain. Patient states she had sudden onset right eye pressure yesterday. She also had some white/yellow drainage yesterday, none today. She states her vision in her right eye seems more "off" than normal. She states it is not blurry or exactly double vision, however this is not as clear as normal. She generally wears glasses only daily basis. She did attend a funeral a few days ago and wore her contacts for approximately 2 hours. She denies any trauma to the eye. No foreign body or chemical exposure to the eye. No history of eye surgeries. She denies any headache, dizziness, confusion, fever, or chills.  VSS.  Past Medical History  Diagnosis Date  . Hypothyroidism   . Anemia   . Anxiety     hx- has gone away since thyroid level controlled  . S/P endometrial ablation 04/06/2015  . Family history of ovarian cancer 04/06/2015  . Female pelvic pain 04/06/2015  . Hematometra 05/05/2015  . S/P laparoscopic assisted vaginal hysterectomy (LAVH) 05/06/2015  . S/P BSO (bilateral salpingo-oophorectomy) 05/06/2015   Past Surgical History  Procedure Laterality Date  . Tubal ligation  1991    bilateral  . Back surgery  2009  . Thyroidectomy    . Laparoscopic assisted vaginal hysterectomy N/A 05/06/2015    Procedure: LAPAROSCOPIC ASSISTED VAGINAL HYSTERECTOMY;  Surgeon: Janyth Contes, MD;  Location: [facility];  Service: Gynecology;  Laterality: N/A;  . Salpingoophorectomy Bilateral 05/06/2015    Procedure: SALPINGO OOPHORECTOMY;  Surgeon: Janyth Contes, MD;  Location: [facility];  Service: Gynecology;  Laterality: Bilateral;   Family History  Problem Relation Age of Onset  . Ovarian cancer Mother   . Mental illness Mother   . Diabetes Mother   . Heart disease Maternal Grandfather   . Cancer Maternal Grandfather 45    colon  . Colon cancer Maternal Grandfather   . Cancer Maternal Aunt 30    blood cancer; unknown type  . Cancer Maternal Grandmother 83    breast  . Esophageal cancer Neg Hx   . Rectal cancer Neg Hx   . Stomach cancer Neg Hx    Social History  Substance Use Topics  . Smoking status: Former Smoker    Types: Cigarettes    Quit date: 10/22/1996  . Smokeless tobacco: Never Used  . Alcohol Use: 0.0 oz/week    0 Standard drinks or equivalent per week     Comment: daily- wine   OB History    No data available     Review of Systems  Eyes: Positive for pain.  All other systems reviewed and are negative.     Allergies  Benzoin; Iodine solution; Oxycodone; and Mouthwashes  Home Medications   Prior to Admission medications   Medication Sig Start Date End Date Taking? Authorizing Provider  ALPRAZolam (XANAX) 0.25 MG tablet TAKE 1 TABLET BY MOUTH EVERY DAY AS NEEDED Patient not taking: Reported on 04/12/2015    Einar Pheasant, MD  alum & mag hydroxide-simeth (MAALOX/MYLANTA) 200-200-20 MG/5ML suspension Take 30 mLs by mouth every 4 (four) hours as needed for indigestion. Patient not  taking: Reported on 05/30/2015 05/07/15   Janyth Contes, MD  flurandrenolide (CORDRAN) 0.05 % lotion Apply 1 application topically 2 (two) times daily.    Historical Provider, MD  HYDROmorphone (DILAUDID) 2 MG tablet Take 1-2 tablets (2-4 mg total) by mouth every 6 (six) hours as needed for severe pain. Patient not taking: Reported on 05/30/2015 05/07/15   Janyth Contes, MD  ibuprofen (ADVIL,MOTRIN) 800 MG tablet Take 1 tablet (800 mg total) by mouth every 8 (eight) hours as needed (mild pain). Patient not taking: Reported on 05/30/2015 05/07/15   Janyth Contes, MD  levothyroxine (SYNTHROID) 125 MCG tablet Take 1 tablet (125 mcg total) by mouth daily before breakfast. 08/04/15   Einar Pheasant, MD  levothyroxine (SYNTHROID, LEVOTHROID) 125 MCG tablet Take 1 tablet (125 mcg total) by mouth daily before breakfast. 08/04/15   Einar Pheasant, MD  Multiple Vitamins-Minerals (MULTIVITAMIN ADULT PO) Take 1 tablet by mouth daily.     Historical Provider, MD   BP 141/90 mmHg  Temp(Src) 98 F (36.7 C) (Oral)  SpO2 100%  LMP 01/18/2014   Physical Exam  Constitutional: She is oriented to person, place, and time. She appears well-developed and well-nourished. No distress.  HENT:  Head: Normocephalic and atraumatic.  Mouth/Throat: Oropharynx is clear and moist.  Eyes: Conjunctivae, EOM and lids are normal. Pupils are equal, round, and reactive to light. Right eye exhibits no discharge, no exudate and no hordeolum. No foreign body present in the right eye. Right conjunctiva is not injected. Right conjunctiva has no hemorrhage. No scleral icterus. Right eye exhibits normal extraocular motion and no nystagmus.  No lid edema or erythema; pupils symmetric and reactive bilaterally; no injection or hemorrhage; no FB; EOMs intact, non-painful; normal confrontation; no visual field cuts IOP left eye 14, right eye 16  Neck: Normal range of motion. Neck supple.  Cardiovascular: Normal rate, regular rhythm and normal heart sounds.   Pulmonary/Chest: Effort normal and breath sounds normal. No respiratory distress. She has no wheezes.  Musculoskeletal: Normal range of motion.  Neurological: She is alert and oriented to person, place, and time.  AAOx3, answering questions appropriately; equal strength UE and LE bilaterally; CN grossly intact; moves all extremities appropriately without ataxia; no focal neuro deficits or facial asymmetry appreciated  Skin: Skin is warm and dry. She is not diaphoretic.  Psychiatric: She has a normal mood and affect.  Nursing  note and vitals reviewed.   ED Course  Procedures (including critical care time) Labs Review Labs Reviewed - No data to display  Imaging Review No results found. I have personally reviewed and evaluated these images and lab results as part of my medical decision-making.   EKG Interpretation None      MDM   Final diagnoses:  Acute right eye pain   49 year old female here with right eye pain. She had some drainage from her eye yesterday, none today. No trauma, chemical or foreign body exposure. She had fluorescein stain performed already which was negative. She denies any foreign body sensation of the eye.  She has normal confrontation on exam without visual field cuts. Neurologically intact here today.  No headache or dizziness.  Visual acuity (corrected with glasses) right eye 20/40, left 20/20, both 20/25.  IOP's left 14, right 16.  Not consistent with acute angle glaucoma.  Patient expresses symptoms of conjunctivitis yesterday, possibly residual irritation. Will cover with antibiotic drops and Acular drops for discomfort. Patient plans to follow-up with eye doctor, Dr. Lucita Ferrara who is a family  friend, on Monday.  Discussed plan with patient, he/she acknowledged understanding and agreed with plan of care.  Return precautions given for new or worsening symptoms.  Case discussed with attending physician, Dr. Audie Pinto, who agrees with assessment and plan of care.  Larene Pickett, PA-C 10/08/15 1447  Leonard Schwartz, MD 10/09/15 [phone]

## 2015-10-08 NOTE — ED Notes (Signed)
Pt with acute onset of eye pain x 3 days, denies injury, [facility] to be checked, reports vison changes

## 2015-10-08 NOTE — ED Notes (Signed)
Pt complains of R eye drainage, pressure and some thick discharge yesterday.

## 2015-10-08 NOTE — Discharge Instructions (Signed)
Your eye pressures were normal today. Do not wear your contact lenses until symptoms fully resolve. Follow-up with Dr. Lucita Ferrara on Monday if no improvement or if symptoms worsen. Return here for new concerns.

## 2015-10-28 ENCOUNTER — Encounter: Payer: Self-pay | Admitting: Internal Medicine

## 2015-10-28 DIAGNOSIS — E039 Hypothyroidism, unspecified: Secondary | ICD-10-CM

## 2015-10-28 NOTE — Telephone Encounter (Signed)
Order placed for referral to endocrinology.  

## 2015-11-02 ENCOUNTER — Ambulatory Visit (INDEPENDENT_AMBULATORY_CARE_PROVIDER_SITE_OTHER)
Admission: RE | Admit: 2015-11-02 | Discharge: 2015-11-02 | Disposition: A | Discharge status: Home / Self Care | Payer: Managed Care, Other (non HMO) | Source: Ambulatory Visit | Attending: Internal Medicine | Admitting: Internal Medicine

## 2015-11-02 ENCOUNTER — Encounter: Payer: Self-pay | Admitting: Internal Medicine

## 2015-11-02 ENCOUNTER — Ambulatory Visit (INDEPENDENT_AMBULATORY_CARE_PROVIDER_SITE_OTHER): Payer: Managed Care, Other (non HMO) | Admitting: Internal Medicine

## 2015-11-02 VITALS — BP 120/82 | HR 64 | Temp 97.8°F | Resp 18 | Ht 66.5 in | Wt 188.5 lb

## 2015-11-02 DIAGNOSIS — M79672 Pain in left foot: Secondary | ICD-10-CM

## 2015-11-02 DIAGNOSIS — R202 Paresthesia of skin: Secondary | ICD-10-CM | POA: Diagnosis not present

## 2015-11-02 DIAGNOSIS — R200 Anesthesia of skin: Secondary | ICD-10-CM

## 2015-11-02 DIAGNOSIS — M79602 Pain in left arm: Secondary | ICD-10-CM

## 2015-11-02 DIAGNOSIS — E039 Hypothyroidism, unspecified: Secondary | ICD-10-CM | POA: Diagnosis not present

## 2015-11-02 DIAGNOSIS — M79601 Pain in right arm: Secondary | ICD-10-CM | POA: Insufficient documentation

## 2015-11-02 DIAGNOSIS — R0981 Nasal congestion: Secondary | ICD-10-CM

## 2015-11-02 MED ORDER — METHYLPREDNISOLONE 4 MG PO TBPK: ORAL_TABLET | ORAL | Status: DC

## 2015-11-02 NOTE — Patient Instructions (Signed)
Saline nasal spray - flush nose at least 2-3x/day  nasacort nasal spray - 2 sprays each nostril one time per day.  Do this in the evening.  

## 2015-11-02 NOTE — Progress Notes (Signed)
Patient ID: Rebecca Gallegos, female   DOB: 1966/02/03, 50 y.o.   MRN: [id number]   Subjective:    Patient ID: Rebecca Gallegos, female    DOB: 02/13/1966, 50 y.o.   MRN: [id number]  HPI  Patient with past history of hypothyroidism.  She comes in today as a work in with several concerns.  She reports recently having increased nasal congestion.  Husband was sick as well.  Is better.  Still some minimal congestion.  No sore throat now.  No chest congestion.  Has been taking otc alka seltzer and theraflu.  She also reports increased pain and numbness/tingling in her upper extremities.  Noticed increased discomfort in her axilla (bilateral, but left is worse than right).  Numbness and tingling in her hands and arms - worse at night/am.  Placing her arm over her head - eases the discomfort in her axilla.  No significant neck pain.  Has had previous neck surgery.  Will occasionally notice some neck discomfort, but no significant change.  No chest pain.  No breast pain.  Just had mammogram in 08/2015.  Ok.  No abdominal pain or cramping.  Taking two alleve q hs.  She also reports left foot/heel pain.  Pain mostly localized over the heel and then extends up the posterior ankle.  Has had plantar fasciitis previously.  This feels different.  Has been added supports.  Taking alleve.  No relief.  No injury.    Past Medical History  Diagnosis Date  . Hypothyroidism   . Anemia   . Anxiety     hx- has gone away since thyroid level controlled  . S/P endometrial ablation 04/06/2015  . Family history of ovarian cancer 04/06/2015  . Female pelvic pain 04/06/2015  . Hematometra 05/05/2015  . S/P laparoscopic assisted vaginal hysterectomy (LAVH) 05/06/2015  . S/P BSO (bilateral salpingo-oophorectomy) 05/06/2015   Past Surgical History  Procedure Laterality Date  . Tubal ligation  1991    bilateral  . Back surgery  2009  . Thyroidectomy    . Laparoscopic assisted vaginal hysterectomy N/A 05/06/2015   Procedure: LAPAROSCOPIC ASSISTED VAGINAL HYSTERECTOMY;  Surgeon: Janyth Contes, MD;  Location: [facility];  Service: Gynecology;  Laterality: N/A;  . Salpingoophorectomy Bilateral 05/06/2015    Procedure: SALPINGO OOPHORECTOMY;  Surgeon: Janyth Contes, MD;  Location: [facility];  Service: Gynecology;  Laterality: Bilateral;   Family History  Problem Relation Age of Onset  . Ovarian cancer Mother   . Mental illness Mother   . Diabetes Mother   . Heart disease Maternal Grandfather   . Cancer Maternal Grandfather 50    colon  . Colon cancer Maternal Grandfather   . Cancer Maternal Aunt 45    blood cancer; unknown type  . Cancer Maternal Grandmother 75    breast  . Esophageal cancer Neg Hx   . Rectal cancer Neg Hx   . Stomach cancer Neg Hx    Social History   Social History  . Marital Status: Married    Spouse Name: N/A  . Number of Children: N/A  . Years of Education: N/A   Social History Main Topics  . Smoking status: Former Smoker    Types: Cigarettes    Quit date: 10/22/1996  . Smokeless tobacco: Never Used  . Alcohol Use: 0.0 oz/week    0 Standard drinks or equivalent per week     Comment: daily- wine  . Drug Use: No  . Sexual Activity: Not Asked   Other Topics Concern  .  None   Social History Narrative    Outpatient Encounter Prescriptions as of 11/02/2015  Medication Sig  . ALPRAZolam (XANAX) 0.25 MG tablet TAKE 1 TABLET BY MOUTH EVERY DAY AS NEEDED  . calcium-vitamin D 250-100 MG-UNIT tablet Take 1 tablet by mouth daily.  Marland Kitchen levothyroxine (SYNTHROID) 125 MCG tablet Take 1 tablet (125 mcg total) by mouth daily before breakfast.  . Multiple Vitamins-Minerals (MULTIVITAMIN ADULT PO) Take 1 tablet by mouth daily.   . [DISCONTINUED] alum & mag hydroxide-simeth (MAALOX/MYLANTA) 200-200-20 MG/5ML suspension Take 30 mLs by mouth every 4 (four) hours as needed for indigestion.  . [DISCONTINUED] ciprofloxacin (CILOXAN) 0.3 % ophthalmic solution Administer 1 drop,  every 2 hours, while awake, for 2 days. Then 1 drop, every 4 hours, while awake, for the next 5 days.  . [DISCONTINUED] flurandrenolide (CORDRAN) 0.05 % lotion Apply 1 application topically 2 (two) times daily.  . [DISCONTINUED] HYDROmorphone (DILAUDID) 2 MG tablet Take 1-2 tablets (2-4 mg total) by mouth every 6 (six) hours as needed for severe pain.  . [DISCONTINUED] ibuprofen (ADVIL,MOTRIN) 800 MG tablet Take 1 tablet (800 mg total) by mouth every 8 (eight) hours as needed (mild pain).  . [DISCONTINUED] ketorolac (ACULAR) 0.5 % ophthalmic solution Place 1 drop into the right eye every 6 (six) hours.  . [DISCONTINUED] levothyroxine (SYNTHROID, LEVOTHROID) 125 MCG tablet Take 1 tablet (125 mcg total) by mouth daily before breakfast.  . methylPREDNISolone (MEDROL DOSEPAK) 4 MG TBPK tablet 6 day taper   No facility-administered encounter medications on file as of 11/02/2015.    Review of Systems  Constitutional: Negative for appetite change and unexpected weight change.  HENT: Positive for congestion.        Nasal congestion.  No sore throat.  Feeling better.   Eyes: Negative for discharge and redness.  Respiratory: Negative for chest tightness, shortness of breath and wheezing.   Cardiovascular: Negative for chest pain, palpitations and leg swelling.  Gastrointestinal: Negative for nausea, vomiting, abdominal pain and diarrhea.  Musculoskeletal:       Bilateral arm pain and numbness/tingling.  Pain in both axilla - left > right.  No significant neck pain.    Skin: Negative for color change and rash.  Neurological: Negative for dizziness, light-headedness and headaches.       Bilateral arm numbness/tingling.   Psychiatric/Behavioral: Negative for dysphoric mood and agitation.       Objective:    Physical Exam  Constitutional: She appears well-developed and well-nourished. No distress.  HENT:  Nose: Nose normal.  Mouth/Throat: Oropharynx is clear and moist.  Eyes: Conjunctivae are  normal. Right eye exhibits no discharge. Left eye exhibits no discharge.  Neck: Neck supple. No thyromegaly present.  Cardiovascular: Normal rate and regular rhythm.   Pulmonary/Chest: Breath sounds normal. No respiratory distress. She has no wheezes.  Abdominal: Soft. Bowel sounds are normal. There is no tenderness.  Musculoskeletal: She exhibits no edema or tenderness.  No neck pain with rotation of her head.  No increased pain with full extension of her arms over her head.  Negative phalens.  Motor strength appears to be normal and equal bilateral upper extremities.  No pain to palpation over the achilles.    Lymphadenopathy:    She has no cervical adenopathy.  Skin: No rash noted. No erythema.  Psychiatric: She has a normal mood and affect. Her behavior is normal.    BP 120/82 mmHg  Pulse 64  Temp(Src) 97.8 F (36.6 C) (Oral)  Resp 18  Ht 5'  6.5" (1.689 m)  Wt 188 lb 8 oz (85.503 kg)  BMI 29.97 kg/m2  SpO2 98%  LMP 01/18/2014 Wt Readings from Last 3 Encounters:  11/02/15 188 lb 8 oz (85.503 kg)  10/08/15 186 lb (84.369 kg)  05/30/15 184 lb 4 oz (83.575 kg)     Lab Results  Component Value Date   WBC 5.5 05/30/2015   HGB 14.4 05/30/2015   HCT 43.1 05/30/2015   PLT 257.0 05/30/2015   GLUCOSE 116* 05/07/2015   CHOL 175 05/25/2014   TRIG 100.0 05/25/2014   HDL 57.90 05/25/2014   LDLCALC 97 05/25/2014   ALT 34 04/21/2015   AST 26 04/21/2015   NA 137 05/07/2015   K 4.5 05/07/2015   CL 103 05/07/2015   CREATININE 0.55 05/07/2015   BUN 7 05/07/2015   CO2 31 05/07/2015   TSH 1.19 09/19/2015   INR 0.97 01/04/2010       Assessment & Plan:   Problem List Items Addressed This Visit    Bilateral arm numbness and tingling while sleeping    See above.  Check c-spine xray.  Medrol dose pack.        Relevant Orders   DG Cervical Spine 2 or 3 views (Completed)   Bilateral arm pain - Primary    Symptoms as outlined.  Unclear as to exact etiology.  Having arm numbness  and tingling as well.   Increased discomfort - bilateral axilla.  No increased neck pain.  Has had neck surgery previously.   No shoulder pain. Axillary discomfort releived some with placing arm over head.  Check c-spine xray.  Medrol dose pack.  Question if need for NCS.         Relevant Orders   DG Cervical Spine 2 or 3 views (Completed)   Hypothyroidism    Recent tsh wnl.  Continue same medication regimen. She has requested to be established with endocrinology.        Left foot pain    Persistent increased left foot/heel pain.  Question of heel spur.  Has tried conservative measures, including supports and antiinflammatories.  Refer to podiatry.        Relevant Orders   Ambulatory referral to Podiatry   Nasal congestion    Saline nasal spray and nasacort nasal spray as directed.  Follow.            Einar Pheasant, MD

## 2015-11-02 NOTE — Progress Notes (Signed)
Pre-visit discussion using our clinic review tool. No additional management support is needed unless otherwise documented below in the visit note.  

## 2015-11-03 ENCOUNTER — Encounter: Payer: Self-pay | Admitting: Internal Medicine

## 2015-11-03 DIAGNOSIS — M79672 Pain in left foot: Secondary | ICD-10-CM | POA: Insufficient documentation

## 2015-11-03 DIAGNOSIS — R0981 Nasal congestion: Secondary | ICD-10-CM | POA: Insufficient documentation

## 2015-11-03 NOTE — Assessment & Plan Note (Signed)
Saline nasal spray and nasacort nasal spray as directed.  Follow.  

## 2015-11-03 NOTE — Assessment & Plan Note (Signed)
Recent tsh wnl.  Continue same medication regimen. She has requested to be established with endocrinology.

## 2015-11-03 NOTE — Assessment & Plan Note (Signed)
See above.  Check c-spine xray.  Medrol dose pack.

## 2015-11-03 NOTE — Assessment & Plan Note (Signed)
Symptoms as outlined.  Unclear as to exact etiology.  Having arm numbness and tingling as well.   Increased discomfort - bilateral axilla.  No increased neck pain.  Has had neck surgery previously.   No shoulder pain. Axillary discomfort releived some with placing arm over head.  Check c-spine xray.  Medrol dose pack.  Question if need for NCS.

## 2015-11-03 NOTE — Assessment & Plan Note (Signed)
Persistent increased left foot/heel pain.  Question of heel spur.  Has tried conservative measures, including supports and antiinflammatories.  Refer to podiatry.

## 2015-11-08 ENCOUNTER — Encounter: Payer: Self-pay | Admitting: Internal Medicine

## 2015-11-11 ENCOUNTER — Encounter: Payer: Self-pay | Admitting: Internal Medicine

## 2015-11-11 MED ORDER — OSELTAMIVIR PHOSPHATE 75 MG PO CAPS: 75.0000 mg | ORAL_CAPSULE | Freq: Every day | ORAL | Status: DC

## 2015-11-11 NOTE — Telephone Encounter (Signed)
Spoke with pt.  tamiflu rx sent in.  She will hold on visiting.

## 2015-11-14 ENCOUNTER — Encounter: Payer: Self-pay | Admitting: Podiatry

## 2015-11-14 ENCOUNTER — Ambulatory Visit (INDEPENDENT_AMBULATORY_CARE_PROVIDER_SITE_OTHER): Payer: Managed Care, Other (non HMO)

## 2015-11-14 ENCOUNTER — Ambulatory Visit (INDEPENDENT_AMBULATORY_CARE_PROVIDER_SITE_OTHER): Payer: Managed Care, Other (non HMO) | Admitting: Podiatry

## 2015-11-14 VITALS — BP 133/85 | HR 65 | Resp 16 | Ht 66.0 in | Wt 185.0 lb

## 2015-11-14 DIAGNOSIS — M722 Plantar fascial fibromatosis: Secondary | ICD-10-CM

## 2015-11-14 MED ORDER — DICLOFENAC SODIUM 75 MG PO TBEC: 75.0000 mg | DELAYED_RELEASE_TABLET | Freq: Two times a day (BID) | ORAL | Status: DC

## 2015-11-14 MED ORDER — TRIAMCINOLONE ACETONIDE 10 MG/ML IJ SUSP
10.0000 mg | Freq: Once | INTRAMUSCULAR | Status: AC
  Administered 2015-11-14: 10 mg

## 2015-11-14 NOTE — Patient Instructions (Signed)

## 2015-11-14 NOTE — Progress Notes (Signed)
   Subjective:    Patient ID: Rebecca Gallegos, female    DOB: May 11, 1966, 50 y.o.   MRN: [id number]  HPI Patient presents with foot pain in their left foot; heel radiating up back of heel. Pt stated, "does not feel have plantar fasciitis"; x3 months.   Review of Systems  Hematological: Bruises/bleeds easily.  All other systems reviewed and are negative.      Objective:   Physical Exam        Assessment & Plan:

## 2015-11-15 NOTE — Progress Notes (Signed)
Subjective:     Patient ID: Rebecca Gallegos, female   DOB: Apr 17, 1966, 50 y.o.   MRN: [id number]  HPI patient points to the bottom of the left heel into the back of the heel states it's been hurting her for about 3 months and is worse when getting up or after periods of sitting but at times can bother her all day   Review of Systems  All other systems reviewed and are negative.      Objective:   Physical Exam  Constitutional: She is oriented to person, place, and time.  Cardiovascular: Intact distal pulses.   Musculoskeletal: Normal range of motion.  Neurological: She is oriented to person, place, and time.  Skin: Skin is warm.  Nursing note and vitals reviewed.  neurovascular status intact muscle strength adequate range of motion within normal limits with patient noted to have quite a bit of discomfort in the plantar aspect of the left heel around the insertion of the fascia to the calcaneus with mild posterior pain which is most likely compensation. Good digital perfusion and patient is well oriented 3     Assessment:     Plantar fasciitis left with probable compensatory Achilles tendinitis    Plan:     H&P and x-rays reviewed with patient. Today I injected the left plantar fascia 3 mg Kenalog 5 mg Xylocaine and applied fascial brace with instructions on usage. Gave instructions on physical therapy oral anti-inflammatories and reappoint in 1-2 weeks

## 2015-11-30 ENCOUNTER — Ambulatory Visit (INDEPENDENT_AMBULATORY_CARE_PROVIDER_SITE_OTHER): Payer: Managed Care, Other (non HMO) | Admitting: Podiatry

## 2015-11-30 ENCOUNTER — Encounter: Payer: Self-pay | Admitting: Podiatry

## 2015-11-30 DIAGNOSIS — E232 Diabetes insipidus: Secondary | ICD-10-CM | POA: Diagnosis not present

## 2015-11-30 DIAGNOSIS — M722 Plantar fascial fibromatosis: Secondary | ICD-10-CM

## 2015-11-30 DIAGNOSIS — M779 Enthesopathy, unspecified: Secondary | ICD-10-CM

## 2015-12-01 ENCOUNTER — Encounter: Payer: Self-pay | Admitting: Internal Medicine

## 2015-12-01 NOTE — Progress Notes (Signed)
Subjective:     Patient ID: Rebecca Gallegos, female   DOB: 04-Jun-1966, 50 y.o.   MRN: [id number]  HPI patient states I may have prediabetes and I still have discomfort in my left heel and the outside of my left foot but it is improved from previous   Review of Systems     Objective:   Physical Exam Neurovascular status intact muscle strength adequate with patient found to have discomfort in the plantar aspect of the left heel and mild discomfort in the lateral side of the left foot    Assessment:     Continued plantar fasciitis-like symptoms with moderate depression of the arch and compensatory tendinitis    Plan:     H&P and conditions reviewed. I've advised on the importance of aggressive physical therapy stretching exercises ice therapy and not going barefoot. I then went ahead today and I scanned for custom orthotics to reduce pressure against the foot

## 2015-12-14 ENCOUNTER — Encounter: Payer: Self-pay | Admitting: Podiatry

## 2015-12-23 ENCOUNTER — Ambulatory Visit (INDEPENDENT_AMBULATORY_CARE_PROVIDER_SITE_OTHER): Payer: Managed Care, Other (non HMO) | Admitting: Podiatry

## 2015-12-23 DIAGNOSIS — M779 Enthesopathy, unspecified: Secondary | ICD-10-CM

## 2015-12-23 DIAGNOSIS — M722 Plantar fascial fibromatosis: Secondary | ICD-10-CM

## 2015-12-23 NOTE — Patient Instructions (Signed)

## 2015-12-23 NOTE — Progress Notes (Signed)
Subjective:     Patient ID: Rebecca Gallegos, female   DOB: 20-Oct-1966, 50 y.o.   MRN: [id number]  HPI this patient presents to the office to pick up her orthotics which were made for her plantar fasciitis. He states that her pain has improved except the occasional pain walking her dog. She says she now has no heel pain but there is pain on the outside of her heels. She presents to pick up her orthotics at this time   Review of Systems neurovascular status and muscle strength intact at this time     Objective:   Physical Exam     Assessment:    Plantar fascitis     Plan:  ROV  Pick up orthoses.    Gardiner Barefoot DPM

## 2016-01-23 ENCOUNTER — Telehealth: Payer: Self-pay | Admitting: Family Medicine

## 2016-01-23 NOTE — Telephone Encounter (Signed)
Ok to transfer. 

## 2016-01-23 NOTE — Telephone Encounter (Signed)
Pt has been scheduled for 03/09/16 @ 3:00.

## 2016-01-23 NOTE — Telephone Encounter (Signed)
Pt would to trans care to Dr Birdie Riddle. Please advise if ok to schedule.

## 2016-03-09 ENCOUNTER — Ambulatory Visit (INDEPENDENT_AMBULATORY_CARE_PROVIDER_SITE_OTHER): Payer: Managed Care, Other (non HMO) | Admitting: Family Medicine

## 2016-03-09 ENCOUNTER — Encounter: Payer: Self-pay | Admitting: Family Medicine

## 2016-03-09 VITALS — BP 110/80 | HR 76 | Temp 98.6°F | Resp 16 | Ht 67.0 in | Wt 176.4 lb

## 2016-03-09 DIAGNOSIS — E039 Hypothyroidism, unspecified: Secondary | ICD-10-CM

## 2016-03-09 DIAGNOSIS — F419 Anxiety disorder, unspecified: Secondary | ICD-10-CM

## 2016-03-09 DIAGNOSIS — R7303 Prediabetes: Secondary | ICD-10-CM | POA: Diagnosis not present

## 2016-03-09 NOTE — Progress Notes (Signed)
Pre visit review using our clinic review tool, if applicable. No additional management support is needed unless otherwise documented below in the visit note. 

## 2016-03-09 NOTE — Patient Instructions (Signed)
Schedule your complete physical in 6 months Keep up the good work on healthy diet and regular exercise- you're doing great!!! Call with any questions or concerns We're glad to have you!!!

## 2016-03-09 NOTE — Progress Notes (Signed)
   Subjective:    Patient ID: Rebecca Gallegos, female    DOB: [birth year], 50 y.o.   MRN: [id number]  HPI New to establish.  Previous MD- Nicki Reaper.  GYN- Bovard, UTD on mammo  Endo- Balan  UTD on colonoscopy  Hypothyroid- chronic problem, following w/ Dr Chalmers Cater.  On Levothyroxine daily.  Denies CP, palpitations, fatigue.  Pre-diabetes- ongoing issue, following w/ Dr Chalmers Cater.  + family hx of DM.  Pt has lost 12 lbs since Jan by 'radically changing my diet'.  Pt travels a lot for work so is not able to exercise.  Anxiety- pt reports that this was related to her thyroid issues and has not had to deal with this recently.  Taking Alprazolam more for sleep and sudden menopausal sxs after TAH-BSO.     Review of Systems For ROS see HPI     Objective:   Physical Exam  Constitutional: She is oriented to person, place, and time. She appears well-developed and well-nourished. No distress.  HENT:  Head: Normocephalic and atraumatic.  Eyes: Conjunctivae and EOM are normal. Pupils are equal, round, and reactive to light.  Neck: Normal range of motion. Neck supple. No thyromegaly present.  Cardiovascular: Normal rate, regular rhythm, normal heart sounds and intact distal pulses.   No murmur heard. Pulmonary/Chest: Effort normal and breath sounds normal. No respiratory distress.  Abdominal: Soft. She exhibits no distension. There is no tenderness.  Musculoskeletal: She exhibits no edema.  Lymphadenopathy:    She has no cervical adenopathy.  Neurological: She is alert and oriented to person, place, and time.  Skin: Skin is warm and dry.  Psychiatric: She has a normal mood and affect. Her behavior is normal.  Vitals reviewed.         Assessment & Plan:

## 2016-03-13 NOTE — Assessment & Plan Note (Signed)
New to provider, ongoing for pt.  Following w/ Dr Chalmers Cater.  Pt is attempting to control w/ healthy diet and regular exercise.  Will continue to follow along and assist as able.

## 2016-03-13 NOTE — Assessment & Plan Note (Signed)
New to provider, ongoing for pt.  Following w/ Dr Chalmers Cater.  Finally feels sxs are well controlled on current Levothyroxine dose.  Will follow along and assist as able.

## 2016-03-13 NOTE — Assessment & Plan Note (Signed)
New to provider, ongoing for pt.  She feels most of her sxs were due to her thyroid issues.  Rarely using Alprazolam and feels sxs are well controlled.  Will continue to follow.

## 2016-04-16 ENCOUNTER — Encounter: Payer: Self-pay | Admitting: Family Medicine

## 2016-04-16 ENCOUNTER — Ambulatory Visit (INDEPENDENT_AMBULATORY_CARE_PROVIDER_SITE_OTHER): Payer: Managed Care, Other (non HMO) | Admitting: Family Medicine

## 2016-04-16 VITALS — BP 138/90 | HR 78 | Temp 98.0°F | Resp 16 | Ht 67.0 in | Wt 169.4 lb

## 2016-04-16 DIAGNOSIS — F419 Anxiety disorder, unspecified: Secondary | ICD-10-CM | POA: Diagnosis not present

## 2016-04-16 MED ORDER — CITALOPRAM HYDROBROMIDE 20 MG PO TABS: 20.0000 mg | ORAL_TABLET | Freq: Every day | ORAL | Status: DC

## 2016-04-16 MED ORDER — ALPRAZOLAM 0.5 MG PO TABS: 0.5000 mg | ORAL_TABLET | Freq: Two times a day (BID) | ORAL | Status: DC | PRN

## 2016-04-16 NOTE — Assessment & Plan Note (Signed)
Deteriorated.  Pt is having full blown panic attacks.  She has not had anxiety like this in 'years' per her and husband's report.  Start daily SSRI for anxiety control and use regularly scheduled Alprazolam for the next week and then back down to prn use.  Reviewed supportive care and red flags that should prompt return.  Pt expressed understanding and is in agreement w/ plan.

## 2016-04-16 NOTE — Progress Notes (Signed)
Pre visit review using our clinic review tool, if applicable. No additional management support is needed unless otherwise documented below in the visit note. 

## 2016-04-16 NOTE — Patient Instructions (Signed)
Follow up in 4 weeks to recheck mood/anxiety Start the Celexa daily Use the Alprazolam twice daily for the next week and then as needed Call with any questions or concerns Hang in there!!!

## 2016-04-16 NOTE — Progress Notes (Signed)
   Subjective:    Patient ID: Rebecca Gallegos, female    DOB: Jan 02, 1966, 50 y.o.   MRN: [id number]  HPI Anxiety- pt reports 'severe problems' w/ anxiety and stress x2 weeks.  Having panic attacks- 'full blown boo-hooing, shaking, vomiting'.  Pt reports she has not had an episode like this in years.  sxs were triggered by loss of water in the house- pump problems.  The water issue has resolved but pt states she can't get a handle on her emotions.  Pt is not eating, sleeping, having difficult time focusing.  Has lost 8 lbs since last visit.  Now seeing a counselor.  Had relief w/ Alprazolam last week.  'i can't calm down'.  Pt was previously on Paxil ~8 yrs ago.  Pt reports root of her anxiety is a controller issue.   Review of Systems For ROS see HPI     Objective:   Physical Exam  Constitutional: She is oriented to person, place, and time. She appears well-developed and well-nourished.  HENT:  Head: Normocephalic and atraumatic.  Neurological: She is alert and oriented to person, place, and time.  Skin: Skin is warm and dry.  Psychiatric: Her behavior is normal. Thought content normal.  Tearful, anxious, rapid pressured speech  Vitals reviewed.         Assessment & Plan:

## 2016-05-16 ENCOUNTER — Ambulatory Visit (INDEPENDENT_AMBULATORY_CARE_PROVIDER_SITE_OTHER): Payer: Managed Care, Other (non HMO) | Admitting: Family Medicine

## 2016-05-16 ENCOUNTER — Encounter: Payer: Self-pay | Admitting: Family Medicine

## 2016-05-16 VITALS — BP 126/84 | HR 76 | Temp 98.1°F | Resp 16 | Ht 67.0 in | Wt 165.1 lb

## 2016-05-16 DIAGNOSIS — F419 Anxiety disorder, unspecified: Secondary | ICD-10-CM | POA: Diagnosis not present

## 2016-05-16 MED ORDER — CITALOPRAM HYDROBROMIDE 40 MG PO TABS: 40.0000 mg | ORAL_TABLET | Freq: Every day | ORAL | 3 refills | Status: DC

## 2016-05-16 NOTE — Patient Instructions (Signed)
Follow up in 2 months to recheck anxiety Increase the Celexa to 40mg  daily- 1 tab daily Continue to work on stress management- you're doing great! Call with any questions or concerns Enjoy the rest of your summer!!!

## 2016-05-16 NOTE — Assessment & Plan Note (Signed)
Improved since starting Celexa 20mg .  Still having moments of feeling overwhelmed and requiring Xanax daily.  Increase to 40mg  daily.  Will continue to monitor.

## 2016-05-16 NOTE — Progress Notes (Signed)
   Subjective:    Patient ID: Rebecca Gallegos, female    DOB: 02-Feb-1966, 50 y.o.   MRN: [id number]  HPI Anxiety- new.  Started on Celexa at last w/ Alprazolam as needed.  Pt reports meds 'kicked in about a week and a half ago'.  Pt reports feeling better, appetite is improving.  Sleeping better- particularly w/ Alprazolam at night.  No longer tearful.  Continues to see therapist regularly.  Pt will get to afternoon and start to feel overwhelmed and require 1/2 tab alprazolam.  Husband got switched back to night shift x3 weeks which has stressed her out.   Review of Systems For ROS see HPI     Objective:   Physical Exam  Constitutional: She is oriented to person, place, and time. She appears well-developed and well-nourished. No distress.  HENT:  Head: Normocephalic and atraumatic.  Neurological: She is alert and oriented to person, place, and time.  Skin: Skin is warm and dry.  Psychiatric: She has a normal mood and affect. Her behavior is normal. Thought content normal.  Vitals reviewed.         Assessment & Plan:

## 2016-05-16 NOTE — Progress Notes (Signed)
Pre visit review using our clinic review tool, if applicable. No additional management support is needed unless otherwise documented below in the visit note. 

## 2016-06-08 ENCOUNTER — Encounter: Payer: Self-pay | Admitting: Family Medicine

## 2016-06-11 MED ORDER — CITALOPRAM HYDROBROMIDE 40 MG PO TABS: 40.0000 mg | ORAL_TABLET | Freq: Every day | ORAL | 1 refills | Status: DC

## 2016-06-18 ENCOUNTER — Ambulatory Visit (INDEPENDENT_AMBULATORY_CARE_PROVIDER_SITE_OTHER): Payer: Managed Care, Other (non HMO) | Admitting: Podiatry

## 2016-06-18 ENCOUNTER — Encounter: Payer: Self-pay | Admitting: Podiatry

## 2016-06-18 DIAGNOSIS — M7750 Other enthesopathy of unspecified foot: Secondary | ICD-10-CM

## 2016-06-18 DIAGNOSIS — M722 Plantar fascial fibromatosis: Secondary | ICD-10-CM

## 2016-06-18 DIAGNOSIS — M6588 Other synovitis and tenosynovitis, other site: Secondary | ICD-10-CM

## 2016-06-18 DIAGNOSIS — M778 Other enthesopathies, not elsewhere classified: Secondary | ICD-10-CM

## 2016-06-18 DIAGNOSIS — M779 Enthesopathy, unspecified: Secondary | ICD-10-CM

## 2016-06-18 MED ORDER — MELOXICAM 15 MG PO TABS: 15.0000 mg | ORAL_TABLET | Freq: Every day | ORAL | 0 refills | Status: DC

## 2016-06-18 MED ORDER — TRIAMCINOLONE ACETONIDE 10 MG/ML IJ SUSP: 10.0000 mg | Freq: Once | INTRAMUSCULAR | Status: DC

## 2016-06-19 NOTE — Progress Notes (Signed)
Subjective:     Patient ID: Rebecca Gallegos, female   DOB: Nov 28, 1965, 50 y.o.   MRN: [id number]  HPI patient and she should've been here quite a bit earlier but she was holding off hoping it would get better and has pain in the bottom of her left heel and the side of her left foot   Review of Systems     Objective:   Physical Exam Neurovascular status intact muscle strength adequate with patient found to have inflammation in the plantar aspect of the left heel in the lateral side of the foot with peroneal insertion with fluid buildup noted    Assessment:     Acute plantar fasciitis left with peroneal tendinitis left    Plan:     Advised on physical therapy anti-inflammatory shoe gear modifications and injected the plantar fascial left 3 mg Kenalog 5 mg Xylocaine and around the peroneal tendon 3 mg Kenalog 5 mg Xylocaine and dispensed night splint with all instructions on usage

## 2016-06-29 ENCOUNTER — Encounter: Payer: Self-pay | Admitting: Family Medicine

## 2016-06-29 ENCOUNTER — Ambulatory Visit (INDEPENDENT_AMBULATORY_CARE_PROVIDER_SITE_OTHER): Payer: Managed Care, Other (non HMO) | Admitting: Family Medicine

## 2016-06-29 VITALS — BP 124/82 | HR 72 | Temp 98.1°F | Resp 16 | Ht 67.0 in | Wt 159.4 lb

## 2016-06-29 DIAGNOSIS — H109 Unspecified conjunctivitis: Secondary | ICD-10-CM

## 2016-06-29 DIAGNOSIS — F419 Anxiety disorder, unspecified: Secondary | ICD-10-CM | POA: Diagnosis not present

## 2016-06-29 MED ORDER — POLYMYXIN B-TRIMETHOPRIM 10000-0.1 UNIT/ML-% OP SOLN: OPHTHALMIC | 0 refills | Status: DC

## 2016-06-29 NOTE — Patient Instructions (Signed)
Follow up as scheduled Start the Polytrim drops- 2 drops to each eye 3x/day for no longer than 7 days Try to avoid rubbing eyes and be vigilant about washing hands Decrease the Celexa to 20mg  daily Call with any questions or concerns Have a great weekend!!!

## 2016-06-29 NOTE — Progress Notes (Signed)
Pre visit review using our clinic review tool, if applicable. No additional management support is needed unless otherwise documented below in the visit note. 

## 2016-06-29 NOTE — Progress Notes (Signed)
   Subjective:    Patient ID: Rebecca Gallegos, female    DOB: 07/23/1966, 50 y.o.   MRN: [id number]  HPI Pink eye- pt woke this AM w/ both eyes red, swollen, crusting.  Eyes were really 'weepy' when she woke.  Denies pain or burning.  No known sick contacts.  No injury to eyes recently.    Anxiety- pt feels the increased dose of Celexa is making her 'too wound'.  Would like to decrease if possible.  Review of Systems For ROS see HPI     Objective:   Physical Exam  Constitutional: She is oriented to person, place, and time. She appears well-developed and well-nourished. No distress.  HENT:  Head: Normocephalic and atraumatic.  Eyes: EOM are normal. Pupils are equal, round, and reactive to light. Right eye exhibits no discharge. Left eye exhibits no discharge.  Bilateral conjunctivitis w/ limbic sparing  Neurological: She is alert and oriented to person, place, and time.  Skin: Skin is warm and dry.  Psychiatric: She has a normal mood and affect. Her behavior is normal. Thought content normal.  Vitals reviewed.         Assessment & Plan:  Conjunctivitis- new.  Bilateral.  Pt's PE and hx consistent w/ conjunctivitis.  This is likely allergic or viral but given the high risk of transmission of bacterial infxn, will start abx eye drops.  Reviewed supportive care and red flags that should prompt return.  Pt expressed understanding and is in agreement w/ plan.   Anxiety- decrease Celexa due to feeling of increased irritability.  Pt expressed understanding and is in agreement w/ plan.

## 2016-07-16 ENCOUNTER — Ambulatory Visit (INDEPENDENT_AMBULATORY_CARE_PROVIDER_SITE_OTHER): Payer: Managed Care, Other (non HMO) | Admitting: Podiatry

## 2016-07-16 DIAGNOSIS — M722 Plantar fascial fibromatosis: Secondary | ICD-10-CM

## 2016-07-16 DIAGNOSIS — M79672 Pain in left foot: Secondary | ICD-10-CM

## 2016-07-16 MED ORDER — MELOXICAM 15 MG PO TABS: 15.0000 mg | ORAL_TABLET | Freq: Every day | ORAL | 1 refills | Status: DC

## 2016-07-16 MED ORDER — BETAMETHASONE SOD PHOS & ACET 6 (3-3) MG/ML IJ SUSP: 12.0000 mg | Freq: Once | INTRAMUSCULAR | Status: DC

## 2016-07-16 MED ORDER — NONFORMULARY OR COMPOUNDED ITEM: 1.0000 g | Freq: Four times a day (QID) | 2 refills | Status: DC

## 2016-07-16 NOTE — Progress Notes (Signed)
Subjective:   Objective: Physical Exam General: The patient is alert and oriented x3 in no acute distress.  Dermatology: Skin is warm, dry and supple bilateral lower extremities. Negative for open lesions or macerations bilateral.   Vascular: Dorsalis Pedis and Posterior Tibial pulses palpable bilateral.  Capillary fill time is immediate to all digits.  Neurological: Epicritic and protective threshold intact bilateral.   Musculoskeletal: Tenderness to palpation at the medial calcaneal tubercale and through the insertion of the plantar fascia of the left foot. All other joints range of motion within normal limits bilateral. Strength 5/5 in all groups bilateral.   Assessment: #1 plantar fasciitis left foot #2 pain in left foot  Problem List Items Addressed This Visit    None    Visit Diagnoses    Plantar fasciitis of left foot    -  Primary   Plantar fasciitis       Pain in left foot           Plan of Care:   1. Patient evaluated. Xrays reviewed.   2. Injection of 0.5cc Celestone soluspan injected into the left plantar fascia.  3. Instructed patient regarding therapies and modalities at home to alleviate symptoms. Patient is continue wearing her plantar fascial brace, night splint, and all conservative modalities including stretching exercises. 4. Rx for Meloxicam given to patient.  5. Prescription for anti-inflammatory pain cream was given to the patient through [facility].  6. Return to clinic in 4 weeks.    Patient is going on vacation to [place] for a few weeks. When the patient returns she's going to bring in her custom orthotics to add arch height of approximately 4 mm.

## 2016-07-17 ENCOUNTER — Ambulatory Visit (INDEPENDENT_AMBULATORY_CARE_PROVIDER_SITE_OTHER): Payer: Managed Care, Other (non HMO) | Admitting: Family Medicine

## 2016-07-17 ENCOUNTER — Encounter: Payer: Self-pay | Admitting: Family Medicine

## 2016-07-17 VITALS — BP 118/72 | HR 58 | Temp 98.0°F | Resp 16 | Ht 67.0 in | Wt 160.4 lb

## 2016-07-17 DIAGNOSIS — Z0000 Encounter for general adult medical examination without abnormal findings: Secondary | ICD-10-CM | POA: Diagnosis not present

## 2016-07-17 DIAGNOSIS — Z23 Encounter for immunization: Secondary | ICD-10-CM

## 2016-07-17 NOTE — Progress Notes (Signed)
Pre visit review using our clinic review tool, if applicable. No additional management support is needed unless otherwise documented below in the visit note. 

## 2016-07-17 NOTE — Patient Instructions (Signed)
Follow up in 6 months to recheck anxiety Your labs look great!  No need to repeat today Continue to work on healthy diet and regular exercise- you look great! Call with any questions or concerns Happy Fall!!!

## 2016-07-17 NOTE — Progress Notes (Signed)
   Subjective:    Patient ID: Rebecca Gallegos, female    DOB: [birth year], 50 y.o.   MRN: [id number]  HPI CPE- UTD on mammo, colonoscopy, Tdap.  No need for pap due to TAH.  Recent labs WNL.   Review of Systems Patient reports no vision/ hearing changes, adenopathy,fever, weight change,  persistant/recurrent hoarseness , swallowing issues, chest pain, palpitations, edema, persistant/recurrent cough, hemoptysis, dyspnea (rest/exertional/paroxysmal nocturnal), gastrointestinal bleeding (melena, rectal bleeding), abdominal pain, significant heartburn, bowel changes, GU symptoms (dysuria, hematuria, incontinence), Gyn symptoms (abnormal  bleeding, pain),  syncope, focal weakness, memory loss, numbness & tingling, skin/hair/nail changes, abnormal bruising or bleeding, anxiety, or depression.     Objective:   Physical Exam General Appearance:    Alert, cooperative, no distress, appears stated age  Head:    Normocephalic, without obvious abnormality, atraumatic  Eyes:    PERRL, conjunctiva/corneas clear, EOM's intact, fundi    benign, both eyes  Ears:    Normal TM's and external ear canals, both ears  Nose:   Nares normal, septum midline, mucosa normal, no drainage    or sinus tenderness  Throat:   Lips, mucosa, and tongue normal; teeth and gums normal  Neck:   Supple, symmetrical, trachea midline, no adenopathy;    Thyroid: no enlargement/tenderness/nodules  Back:     Symmetric, no curvature, ROM normal, no CVA tenderness  Lungs:     Clear to auscultation bilaterally, respirations unlabored  Chest Wall:    No tenderness or deformity   Heart:    Regular rate and rhythm, S1 and S2 normal, no murmur, rub   or gallop  Breast Exam:    Deferred to mammo  Abdomen:     Soft, non-tender, bowel sounds active all four quadrants,    no masses, no organomegaly  Genitalia:    Deferred due to TAH  Rectal:    Extremities:   Extremities normal, atraumatic, no cyanosis or edema  Pulses:   2+ and  symmetric all extremities  Skin:   Skin color, texture, turgor normal, no rashes or lesions  Lymph nodes:   Cervical, supraclavicular, and axillary nodes normal  Neurologic:   CNII-XII intact, normal strength, sensation and reflexes    throughout          Assessment & Plan:  PE- PE WNL.  UTD on colonoscopy, mammo, Tdap.  Flu shot given today.  Reviewed recent labs from Dr Chalmers Cater- TSH, lipids, Cr, A1C all WNL.  No need to repeat today.  Anticipatory guidance provided.

## 2016-08-07 ENCOUNTER — Other Ambulatory Visit: Payer: Self-pay | Admitting: Family Medicine

## 2016-08-07 DIAGNOSIS — Z1231 Encounter for screening mammogram for malignant neoplasm of breast: Secondary | ICD-10-CM

## 2016-08-13 ENCOUNTER — Ambulatory Visit (INDEPENDENT_AMBULATORY_CARE_PROVIDER_SITE_OTHER): Payer: Managed Care, Other (non HMO) | Admitting: Podiatry

## 2016-08-13 ENCOUNTER — Encounter: Payer: Self-pay | Admitting: Podiatry

## 2016-08-13 VITALS — BP 118/71 | HR 72 | Resp 16

## 2016-08-13 DIAGNOSIS — M79672 Pain in left foot: Secondary | ICD-10-CM | POA: Diagnosis not present

## 2016-08-13 DIAGNOSIS — M722 Plantar fascial fibromatosis: Secondary | ICD-10-CM

## 2016-08-14 ENCOUNTER — Encounter: Payer: Self-pay | Admitting: Family Medicine

## 2016-08-14 MED ORDER — CITALOPRAM HYDROBROMIDE 10 MG PO TABS: ORAL_TABLET | ORAL | 0 refills | Status: DC

## 2016-08-24 ENCOUNTER — Ambulatory Visit
Admission: RE | Admit: 2016-08-24 | Discharge: 2016-08-24 | Disposition: A | Discharge status: Home / Self Care | Payer: Managed Care, Other (non HMO) | Source: Ambulatory Visit | Attending: Family Medicine | Admitting: Family Medicine

## 2016-08-24 DIAGNOSIS — Z1231 Encounter for screening mammogram for malignant neoplasm of breast: Secondary | ICD-10-CM

## 2016-08-27 ENCOUNTER — Encounter: Payer: Self-pay | Admitting: Family Medicine

## 2016-08-27 MED ORDER — CITALOPRAM HYDROBROMIDE 10 MG PO TABS: ORAL_TABLET | ORAL | 1 refills | Status: DC

## 2016-08-27 NOTE — Progress Notes (Signed)
Subjective:  Patient presents today for follow-up evaluation of plantar fasciitis and pain in foot. Patient states she is doing so much better. Patient states that the injections did help. Patient states he is taking meloxicam daily. Patient is also using anti-inflammatory pain cream prescribed. Patient notices 90% improvement.    Objective/Physical Exam General: The patient is alert and oriented x3 in no acute distress.  Dermatology: Skin is warm, dry and supple bilateral lower extremities. Negative for open lesions or macerations.  Vascular: Palpable pedal pulses bilaterally. No edema or erythema noted. Capillary refill within normal limits.  Neurological: Epicritic and protective threshold grossly intact bilaterally.   Musculoskeletal Exam: Minimal pain on palpation to the medial calcaneal tubercle left foot at the insertion of the plantar fascia. Range of motion within normal limits to all pedal and ankle joints bilateral. Muscle strength 5/5 in all groups bilateral.   Assessment: #1 plantar fasciitis left foot #2 pain in left foot   Plan of Care:  #1 Patient was evaluated. #2 no injections needed today. #3 patient brought her old orthotics, she wants the arch height of approximately 4 mm bilaterally. #4 return to clinic in 3 weeks for orthotic pickup   Dr. Edrick Kins, [place]

## 2016-09-10 ENCOUNTER — Ambulatory Visit (INDEPENDENT_AMBULATORY_CARE_PROVIDER_SITE_OTHER): Payer: Managed Care, Other (non HMO) | Admitting: Podiatry

## 2016-09-10 DIAGNOSIS — M722 Plantar fascial fibromatosis: Secondary | ICD-10-CM

## 2016-09-10 NOTE — Patient Instructions (Signed)

## 2016-09-10 NOTE — Progress Notes (Signed)
Patient ID: Rebecca Gallegos, female   DOB: 01-07-1966, 50 y.o.   MRN: [id number]  Patient presents for orthotic pick up.  Verbal and written break in and wear instructions given.  Patient will follow up in 4 weeks if symptoms worsen or fail to improve.

## 2016-09-26 ENCOUNTER — Encounter: Payer: Self-pay | Admitting: Family Medicine

## 2016-09-26 MED ORDER — ALPRAZOLAM 0.5 MG PO TABS: 0.5000 mg | ORAL_TABLET | Freq: Two times a day (BID) | ORAL | 3 refills | Status: DC | PRN

## 2016-10-09 ENCOUNTER — Ambulatory Visit (INDEPENDENT_AMBULATORY_CARE_PROVIDER_SITE_OTHER): Payer: Managed Care, Other (non HMO) | Admitting: Family Medicine

## 2016-10-09 ENCOUNTER — Encounter: Payer: Self-pay | Admitting: Family Medicine

## 2016-10-09 VITALS — BP 119/81 | HR 60 | Temp 98.1°F | Resp 16 | Ht 67.0 in | Wt 162.4 lb

## 2016-10-09 DIAGNOSIS — H9313 Tinnitus, bilateral: Secondary | ICD-10-CM

## 2016-10-09 NOTE — Progress Notes (Signed)
Pre visit review using our clinic review tool, if applicable. No additional management support is needed unless otherwise documented below in the visit note. 

## 2016-10-09 NOTE — Progress Notes (Signed)
   Subjective:    Patient ID: Rebecca Gallegos, female    DOB: [birth year], 50 y.o.   MRN: [id number]  HPI Tinnitus- predominately R ear but will occur in both.  Sxs started ~1 month ago.  Pt reports she has hx of this but it is louder and more constant.  Some associated lightheadedness.  No recent injury.  + sinus congestion/pressure.  Using nasonex every morning but not using daily antihistamine.     Review of Systems For ROS see HPI     Objective:   Physical Exam  Constitutional: She appears well-developed and well-nourished. No distress.  HENT:  Head: Normocephalic and atraumatic.  Right Ear: Tympanic membrane is retracted. A middle ear effusion is present.  Left Ear: Tympanic membrane is retracted. A middle ear effusion is present.  Nose: Mucosal edema and rhinorrhea present. Right sinus exhibits no maxillary sinus tenderness and no frontal sinus tenderness. Left sinus exhibits no maxillary sinus tenderness and no frontal sinus tenderness.  Mouth/Throat: Mucous membranes are normal. Posterior oropharyngeal erythema (w/ PND) present.  Eyes: Conjunctivae and EOM are normal. Pupils are equal, round, and reactive to light.  Neck: Normal range of motion. Neck supple.  Cardiovascular: Normal rate, regular rhythm and normal heart sounds.   Pulmonary/Chest: Effort normal and breath sounds normal. No respiratory distress. She has no wheezes. She has no rales.  Lymphadenopathy:    She has no cervical adenopathy.  Vitals reviewed.         Assessment & Plan:  Tinnitus- new.  Suspect this is due to allergy congestion.  Start daily antihistamine and add OTC decongestant.  If no improvement will refer to ENT.  Reviewed supportive care and red flags that should prompt return.  Pt expressed understanding and is in agreement w/ plan.

## 2016-10-09 NOTE — Patient Instructions (Signed)
I suspect that your symptoms are due to allergic congestion Continue the Nasonex- 2 sprays each nostril daily Start daily Claritin or Zyrtec to improve congestion and ringing in the ears Start 2-3 days of OTC decongestant like Sudafed or Phenylephrine Drink plenty of fluids Call with any questions or concerns- particularly if no improvement Hang in there!!! Happy Holidays!!!

## 2016-11-29 IMAGING — CR DG CERVICAL SPINE 2 OR 3 VIEWS
3 series · 3 of 3 positions shown · non-contrast
Comparison: No prior.

CLINICAL DATA: Neck pain.

EXAM:
CERVICAL SPINE - 2-3 VIEW

[view not recorded (1 of 3)]
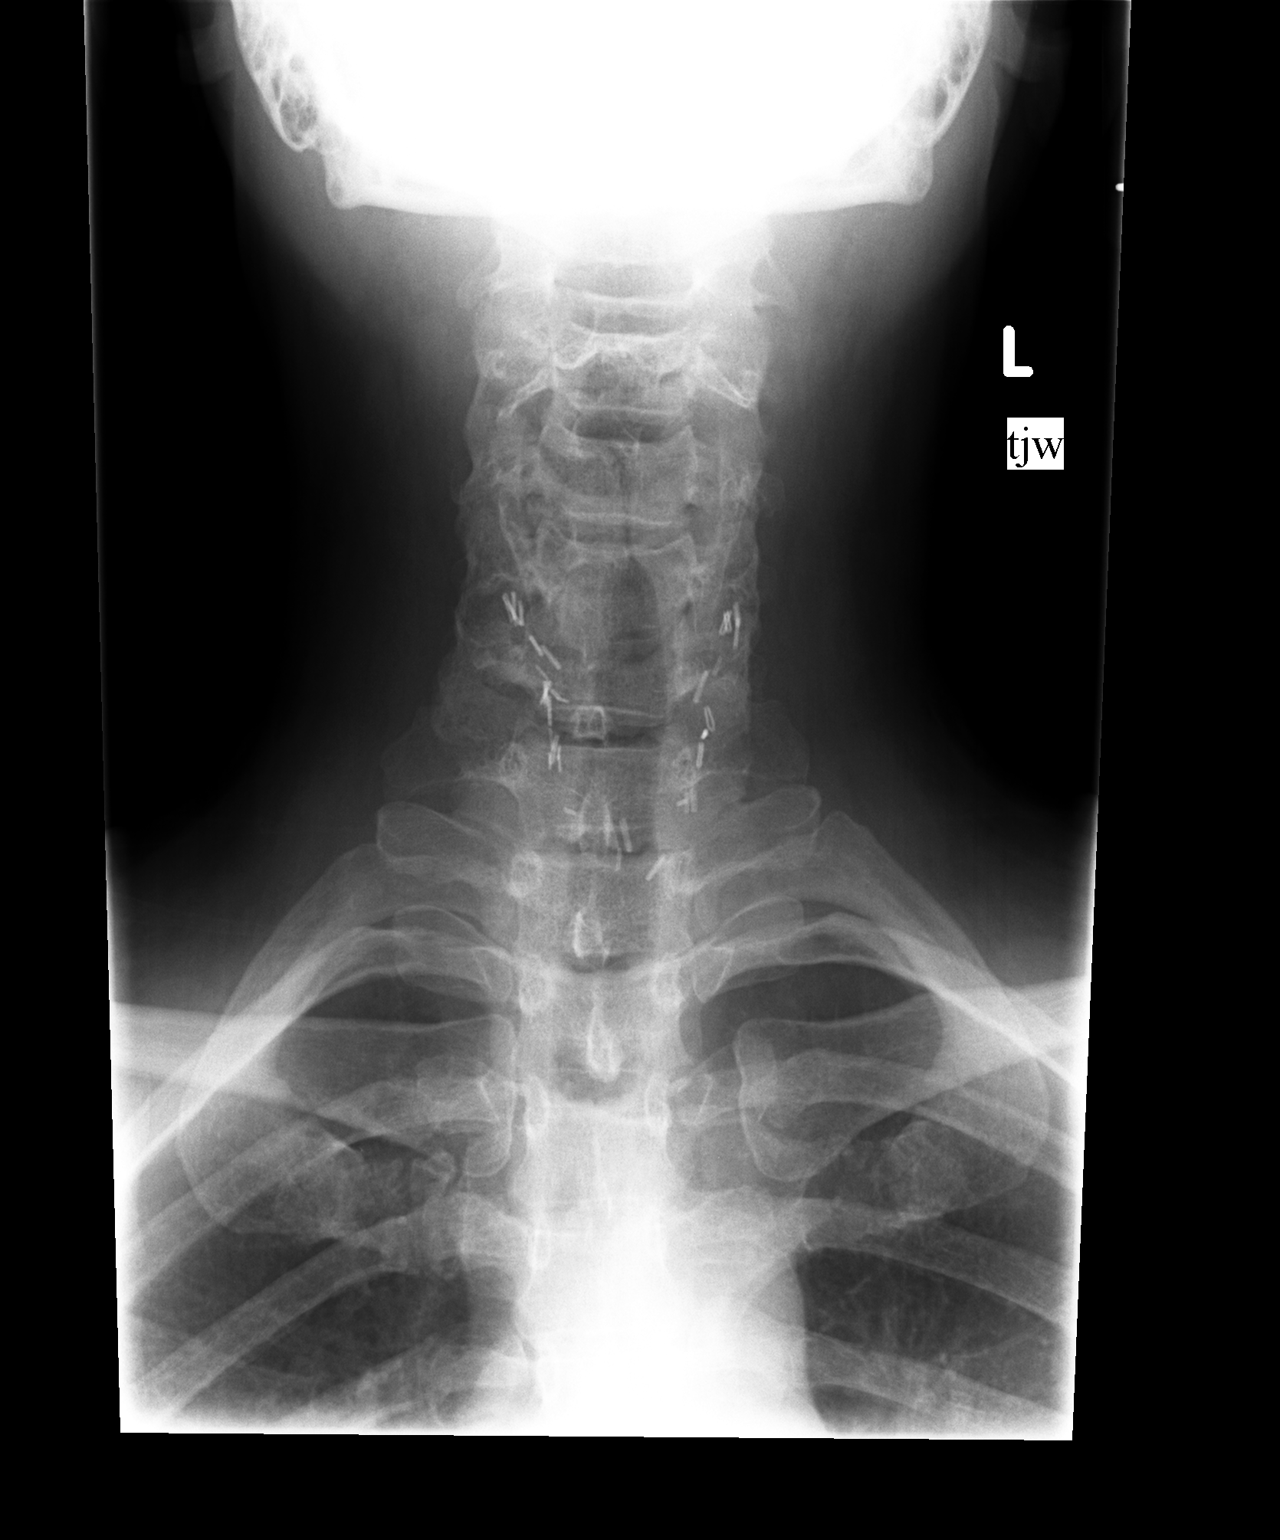

[view not recorded (2 of 3)]
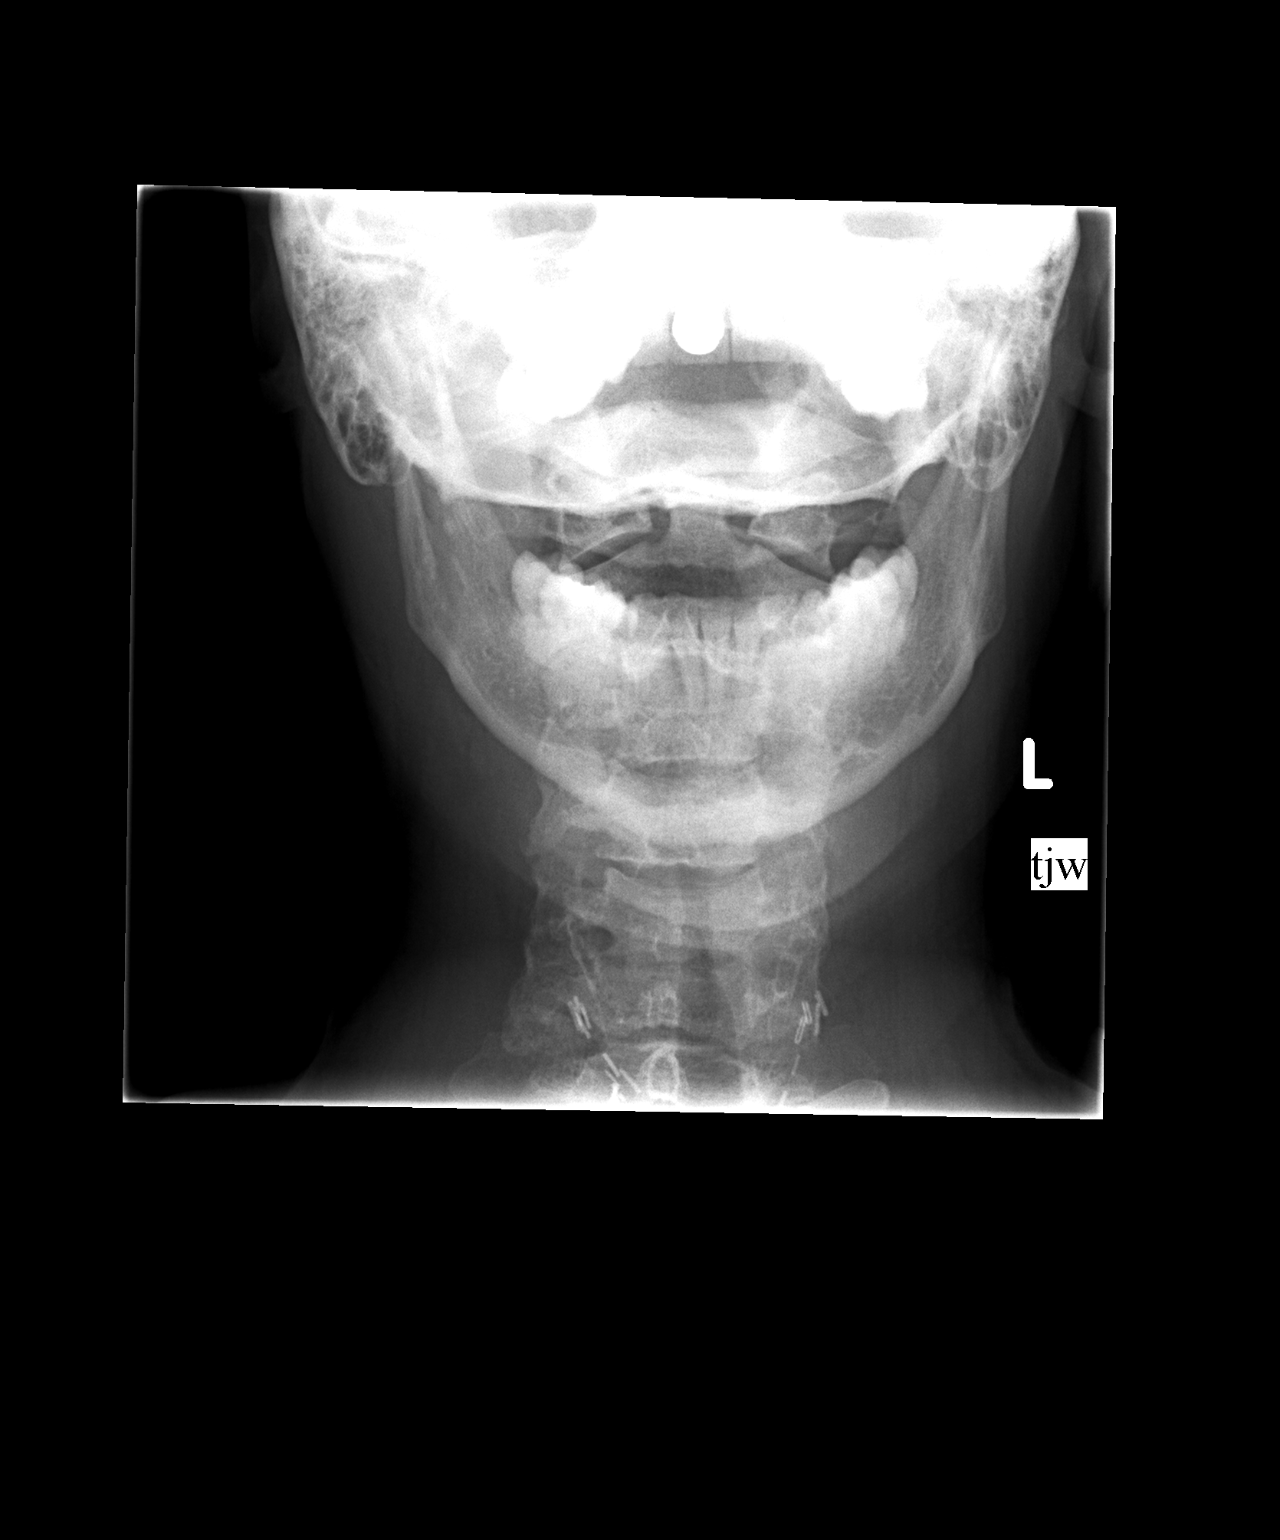

[view not recorded (3 of 3)]
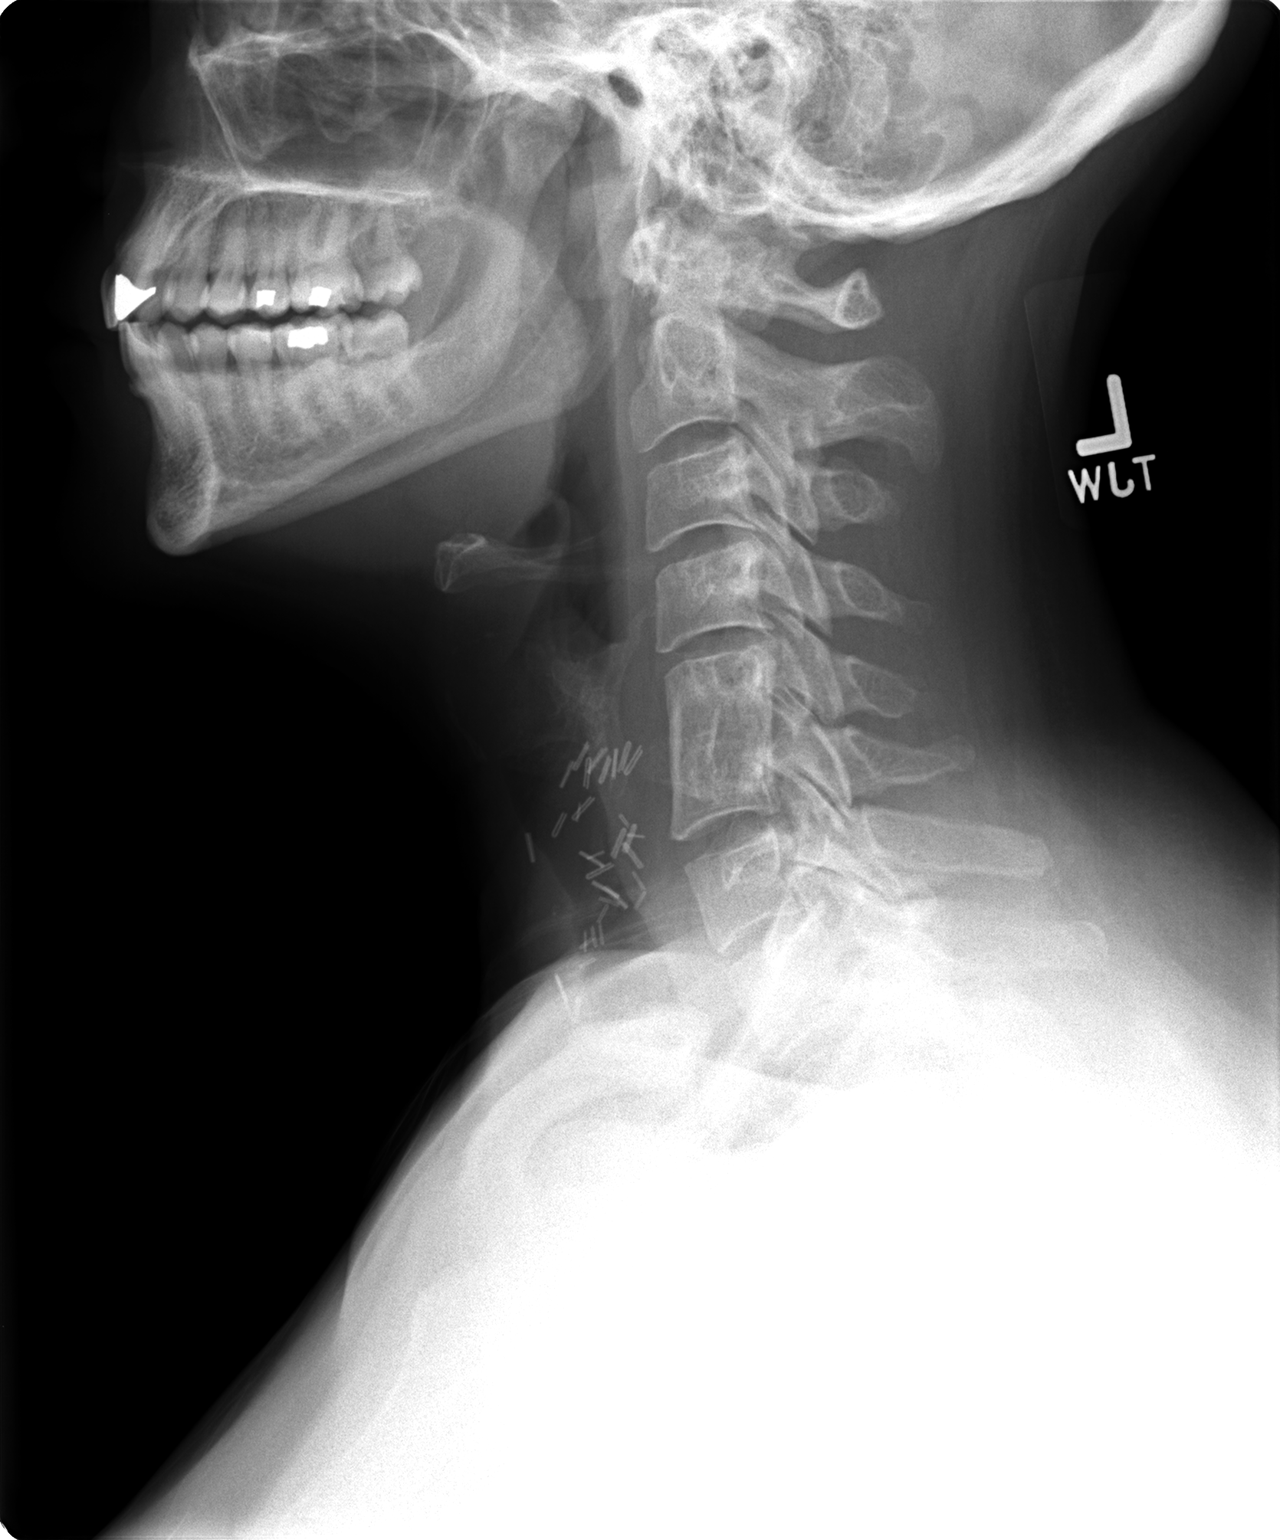

[3 of 3 positions shown; findings below may reference images not displayed]

FINDINGS: Surgical clips are noted over the anterior neck. C5-C6 fusion. No
evidence of fracture dislocation. Good alignment. Normal
mineralization.
IMPRESSION: 1.  C5-C6 fusion.  Good alignment.  No acute abnormality.

2. Surgical clips noted over the anterior neck. Soft tissues appear
normal.

## 2016-11-30 ENCOUNTER — Encounter: Payer: Self-pay | Admitting: Family Medicine

## 2016-11-30 MED ORDER — CITALOPRAM HYDROBROMIDE 10 MG PO TABS: ORAL_TABLET | ORAL | 1 refills | Status: DC

## 2017-01-14 ENCOUNTER — Ambulatory Visit (INDEPENDENT_AMBULATORY_CARE_PROVIDER_SITE_OTHER): Payer: Commercial Managed Care - PPO | Admitting: Family Medicine

## 2017-01-14 ENCOUNTER — Encounter: Payer: Self-pay | Admitting: Family Medicine

## 2017-01-14 VITALS — BP 118/78 | HR 62 | Temp 98.1°F | Resp 16 | Ht 67.0 in | Wt 164.4 lb

## 2017-01-14 DIAGNOSIS — F419 Anxiety disorder, unspecified: Secondary | ICD-10-CM | POA: Diagnosis not present

## 2017-01-14 NOTE — Patient Instructions (Signed)
Schedule your complete physical for September Continue the 10mg  Celexa daily Continue to use the Alprazolam as needed for sleep and anxiety Call with any questions or concerns Happy Early Rudene Anda!!!

## 2017-01-14 NOTE — Progress Notes (Signed)
   Subjective:    Patient ID: Rebecca Gallegos, female    DOB: 1966-08-07, 51 y.o.   MRN: [id number]  HPI Anxiety- chronic problem, on Celexa 10mg  daily.  Reports that overall 'i'm much better'.  Still has some bouts of anxiety but the mental clouding that she experienced at 20mg  has resolved w/ the lower dose.  Able to focus at work and be productive.   Review of Systems For ROS see HPI     Objective:   Physical Exam  Constitutional: She is oriented to person, place, and time. She appears well-developed and well-nourished. No distress.  HENT:  Head: Normocephalic and atraumatic.  Neurological: She is alert and oriented to person, place, and time.  Skin: Skin is warm and dry.  Psychiatric: She has a normal mood and affect. Her behavior is normal. Thought content normal.  Vitals reviewed.         Assessment & Plan:

## 2017-01-14 NOTE — Progress Notes (Signed)
Pre visit review using our clinic review tool, if applicable. No additional management support is needed unless otherwise documented below in the visit note. 

## 2017-01-14 NOTE — Assessment & Plan Note (Signed)
Improved since decreasing the Celexa to 10mg  daily.  The decreased dose eliminated brain fog and her productivity and focus is better.  No med changes at this time.  Will follow.

## 2017-01-29 ENCOUNTER — Telehealth: Payer: Self-pay

## 2017-01-29 NOTE — Telephone Encounter (Addendum)
Spoke with patient regarding symptoms (scheduled MyChart visit). Patients son passed away recently d/t opoid overdose. During emotional episodes, she reports visual changes, pounding heart beat and feeling like she's going to pass out. Patient states symptoms are only when she's emotional and is due to anxiety and stress. Patient has been taking Xanax more frequently which has helped some. Patient denies any symptoms at this time, advised to go to the Emergency Room with chest pain and/or SOB. Patient verbalized understanding, is scheduled to see PCP on 01/30/2017.

## 2017-01-30 ENCOUNTER — Ambulatory Visit (INDEPENDENT_AMBULATORY_CARE_PROVIDER_SITE_OTHER): Payer: Commercial Managed Care - PPO | Admitting: Family Medicine

## 2017-01-30 ENCOUNTER — Encounter: Payer: Self-pay | Admitting: Family Medicine

## 2017-01-30 VITALS — BP 126/86 | HR 64 | Temp 98.1°F | Resp 16 | Ht 67.0 in | Wt 164.2 lb

## 2017-01-30 DIAGNOSIS — F4323 Adjustment disorder with mixed anxiety and depressed mood: Secondary | ICD-10-CM

## 2017-01-30 MED ORDER — TRAZODONE HCL 50 MG PO TABS: 25.0000 mg | ORAL_TABLET | Freq: Every evening | ORAL | 3 refills | Status: DC | PRN

## 2017-01-30 NOTE — Progress Notes (Signed)
Pre visit review using our clinic review tool, if applicable. No additional management support is needed unless otherwise documented below in the visit note. 

## 2017-01-30 NOTE — Patient Instructions (Signed)
Follow up in 1 month to recheck grief Continue the Citalopram daily Use the Trazodone nightly for sleep I am proud of you for reaching out for help! Take 1 hour, 1 day at a time You can do this!  It will take time but you can do this! Call if we can help in any way! Hang in there!!!

## 2017-01-30 NOTE — Progress Notes (Signed)
   Subjective:    Patient ID: Rebecca Gallegos, female    DOB: 09/09/1966, 51 y.o.   MRN: [id number]  HPI Grief- son was 41 yrs old and living in [place] when he died of opiate overdose.  Pt did not know that he had a problem.  She is seeing her therapist Wells Guiles) on Monday after getting home on Saturday.  Admits to increased Alprazolam use for the last 2 weeks and will run out early.  Not sleeping 'at all'.  Is not back to work- 'i can't focus.  i'm all over the place'.  Had a few episodes of pre-syncope due to fatigue.  Pt is now part of a support group for parents who have lost children to opiates.   Review of Systems     Objective:   Physical Exam  Constitutional: She is oriented to person, place, and time. She appears well-developed and well-nourished. She appears distressed (tearful, difficult time composing herself).  Neurological: She is alert and oriented to person, place, and time.  Skin: Skin is warm and dry.  Psychiatric: Her behavior is normal. Thought content normal.  Appropriate tearful, anxious  Vitals reviewed.         Assessment & Plan:  Adjustment disorder- new.  Pt is appropriately distraught over her son's recent death.  She has reached out for resources- Hospice, her counselor.  Continue Citalopram daily.  Start Trazodone for sleep.  Total time spent w/ pt- 34 minutes, >50% spent counseling.

## 2017-03-08 ENCOUNTER — Ambulatory Visit (INDEPENDENT_AMBULATORY_CARE_PROVIDER_SITE_OTHER): Payer: Commercial Managed Care - PPO | Admitting: Family Medicine

## 2017-03-08 ENCOUNTER — Encounter: Payer: Self-pay | Admitting: Family Medicine

## 2017-03-08 VITALS — BP 122/80 | HR 79 | Resp 16 | Ht 67.0 in | Wt 163.5 lb

## 2017-03-08 DIAGNOSIS — Z634 Disappearance and death of family member: Secondary | ICD-10-CM

## 2017-03-08 DIAGNOSIS — F4321 Adjustment disorder with depressed mood: Secondary | ICD-10-CM | POA: Diagnosis not present

## 2017-03-08 MED ORDER — ALPRAZOLAM 0.5 MG PO TABS: 0.5000 mg | ORAL_TABLET | Freq: Two times a day (BID) | ORAL | 3 refills | Status: DC | PRN

## 2017-03-08 NOTE — Progress Notes (Signed)
   Subjective:    Patient ID: Rebecca Gallegos, female    DOB: Feb 22, 1966, 51 y.o.   MRN: [id number]  HPI Grief- continues since son OD'd.  Currently in Hospice support group, seeing her therapist regularly.  Pt took Trazodone for 4 nights but she didn't like it and it wasn't effective.  Weaned herself off Celexa- 'it was just 1 more thing I had to do'.  She reports feeling better off the medication.  Reports she is emotionally and physically exhausted but she understands this is part of the process.   Review of Systems For ROS see HPI     Objective:   Physical Exam  Constitutional: She is oriented to person, place, and time. She appears well-developed and well-nourished. No distress.  HENT:  Head: Normocephalic and atraumatic.  Neurological: She is alert and oriented to person, place, and time.  Skin: Skin is warm and dry.  Psychiatric: She has a normal mood and affect. Her behavior is normal. Thought content normal.  Vitals reviewed.         Assessment & Plan:  Grief- ongoing issue for pt.  She is plugged in with her therapist and is part of a support group through [facility] for [facility].  She is not interested in SSRI at this time and she weaned off her Celexa.  Continues to use Alprazolam 1-2x/day to help w/ anxiety and grief.  Refill provided.  Will continue to follow closely.

## 2017-03-08 NOTE — Patient Instructions (Addendum)
Schedule your complete physical for September If things change or worsen, please let me know and we can re-visit the idea of medication Call with any questions or concerns I am SO proud of you!!!  You are so strong!!!

## 2017-03-08 NOTE — Progress Notes (Signed)
Pre visit review using our clinic review tool, if applicable. No additional management support is needed unless otherwise documented below in the visit note. 

## 2017-05-14 ENCOUNTER — Encounter: Payer: Self-pay | Admitting: Family Medicine

## 2017-05-26 ENCOUNTER — Encounter: Payer: Self-pay | Admitting: Family Medicine

## 2017-05-27 MED ORDER — CITALOPRAM HYDROBROMIDE 20 MG PO TABS: 20.0000 mg | ORAL_TABLET | Freq: Every day | ORAL | 0 refills | Status: DC

## 2017-06-28 ENCOUNTER — Other Ambulatory Visit: Payer: Self-pay | Admitting: Physician Assistant

## 2017-07-19 ENCOUNTER — Encounter: Payer: Self-pay | Admitting: Family Medicine

## 2017-07-19 ENCOUNTER — Ambulatory Visit (INDEPENDENT_AMBULATORY_CARE_PROVIDER_SITE_OTHER): Payer: Commercial Managed Care - PPO | Admitting: Family Medicine

## 2017-07-19 VITALS — BP 110/82 | HR 82 | Temp 98.2°F | Resp 16 | Ht 67.0 in | Wt 174.1 lb

## 2017-07-19 DIAGNOSIS — Z23 Encounter for immunization: Secondary | ICD-10-CM

## 2017-07-19 DIAGNOSIS — E039 Hypothyroidism, unspecified: Secondary | ICD-10-CM

## 2017-07-19 DIAGNOSIS — Z0000 Encounter for general adult medical examination without abnormal findings: Secondary | ICD-10-CM | POA: Diagnosis not present

## 2017-07-19 LAB — LIPID PANEL
CHOL/HDL RATIO: 3
Cholesterol: 193 mg/dL (ref 0–200)
HDL: 59.3 mg/dL (ref >39.00)
LDL CALC: 122 mg/dL — AB (ref 0–99)
NonHDL: 133.52
Triglycerides: 60 mg/dL (ref 0.0–149.0)
VLDL: 12 mg/dL (ref 0.0–40.0)

## 2017-07-19 LAB — CBC WITH DIFFERENTIAL/PLATELET
BASOS ABS: 0 10*3/uL (ref 0.0–0.1)
BASOS PCT: 0.9 % (ref 0.0–3.0)
EOS ABS: 0.1 10*3/uL (ref 0.0–0.7)
Eosinophils Relative: 2.2 % (ref 0.0–5.0)
HCT: 41.7 % (ref 36.0–46.0)
Hemoglobin: 13.6 g/dL (ref 12.0–15.0)
LYMPHS PCT: 40.4 % (ref 12.0–46.0)
Lymphs Abs: 2.2 10*3/uL (ref 0.7–4.0)
MCHC: 32.6 g/dL (ref 30.0–36.0)
MCV: 88.2 fl (ref 78.0–100.0)
MONOS PCT: 8.9 % (ref 3.0–12.0)
Monocytes Absolute: 0.5 10*3/uL (ref 0.1–1.0)
NEUTROS ABS: 2.6 10*3/uL (ref 1.4–7.7)
NEUTROS PCT: 47.6 % (ref 43.0–77.0)
Platelets: 246 10*3/uL (ref 150.0–400.0)
RBC: 4.73 Mil/uL (ref 3.87–5.11)
RDW: 13 % (ref 11.5–15.5)
WBC: 5.4 10*3/uL (ref 4.0–10.5)

## 2017-07-19 LAB — HEPATIC FUNCTION PANEL
ALT: 11 U/L (ref 0–35)
AST: 11 U/L (ref 0–37)
Albumin: 4.4 g/dL (ref 3.5–5.2)
Alkaline Phosphatase: 61 U/L (ref 39–117)
BILIRUBIN DIRECT: 0.1 mg/dL (ref 0.0–0.3)
BILIRUBIN TOTAL: 0.5 mg/dL (ref 0.2–1.2)
TOTAL PROTEIN: 6.9 g/dL (ref 6.0–8.3)

## 2017-07-19 NOTE — Progress Notes (Signed)
Pre visit review using our clinic review tool, if applicable. No additional management support is needed unless otherwise documented below in the visit note. 

## 2017-07-19 NOTE — Assessment & Plan Note (Signed)
Pt following w/ Endo.  Reviewed recent labs.  Will follow along.

## 2017-07-19 NOTE — Patient Instructions (Signed)
Follow up in 1 year or as needed We'll notify you of your lab results and make any changes if needed Keep up the good work on healthy diet and regular exercise- you look great! Increase the Citalopram on your week off Call with any questions or concerns Happy Fall!!!

## 2017-07-19 NOTE — Progress Notes (Signed)
   Subjective:    Patient ID: Rebecca Gallegos, female    DOB: June 06, 1966, 51 y.o.   MRN: [id number]  HPI CPE- UTD on mammo, colonoscopy.  No need for pap due to hysterectomy.     Review of Systems Patient reports no vision/ hearing changes, adenopathy,fever, weight change,  persistant/recurrent hoarseness , swallowing issues, chest pain, palpitations, edema, persistant/recurrent cough, hemoptysis, dyspnea (rest/exertional/paroxysmal nocturnal), gastrointestinal bleeding (melena, rectal bleeding), abdominal pain, significant heartburn, bowel changes, GU symptoms (dysuria, hematuria, incontinence), Gyn symptoms (abnormal  bleeding, pain),  syncope, focal weakness, memory loss, numbness & tingling, skin/hair/nail changes, abnormal bruising or bleeding, anxiety, or depression.     Objective:   Physical Exam General Appearance:    Alert, cooperative, no distress, appears stated age  Head:    Normocephalic, without obvious abnormality, atraumatic  Eyes:    PERRL, conjunctiva/corneas clear, EOM's intact, fundi    benign, both eyes  Ears:    Normal TM's and external ear canals, both ears  Nose:   Nares normal, septum midline, mucosa normal, no drainage    or sinus tenderness  Throat:   Lips, mucosa, and tongue normal; teeth and gums normal  Neck:   Supple, symmetrical, trachea midline, no adenopathy;    Thyroid: no enlargement/tenderness/nodules  Back:     Symmetric, no curvature, ROM normal, no CVA tenderness  Lungs:     Clear to auscultation bilaterally, respirations unlabored  Chest Wall:    No tenderness or deformity   Heart:    Regular rate and rhythm, S1 and S2 normal, no murmur, rub   or gallop  Breast Exam:    Deferred to GYN  Abdomen:     Soft, non-tender, bowel sounds active all four quadrants,    no masses, no organomegaly  Genitalia:    Deferred to GYN  Rectal:    Extremities:   Extremities normal, atraumatic, no cyanosis or edema  Pulses:   2+ and symmetric all extremities    Skin:   Skin color, texture, turgor normal, no rashes or lesions  Lymph nodes:   Cervical, supraclavicular, and axillary nodes normal  Neurologic:   CNII-XII intact, normal strength, sensation and reflexes    throughout          Assessment & Plan:

## 2017-07-19 NOTE — Assessment & Plan Note (Signed)
Pt's PE WNL.  UTD on GYN, colonoscopy.  Flu shot given.  Check labs.  Anticipatory guidance provided.

## 2017-07-22 ENCOUNTER — Encounter: Payer: Self-pay | Admitting: General Practice

## 2017-08-24 ENCOUNTER — Other Ambulatory Visit: Payer: Self-pay | Admitting: Family Medicine

## 2017-08-26 NOTE — Telephone Encounter (Signed)
Last OV 07/19/17 Alprazolam last filled 03/08/17 #60 with 3

## 2017-08-26 NOTE — Telephone Encounter (Signed)
Medication filled to pharmacy as requested.   

## 2017-09-02 ENCOUNTER — Other Ambulatory Visit: Payer: Self-pay | Admitting: Family Medicine

## 2017-09-02 DIAGNOSIS — Z139 Encounter for screening, unspecified: Secondary | ICD-10-CM

## 2017-09-20 ENCOUNTER — Ambulatory Visit
Admission: RE | Admit: 2017-09-20 | Discharge: 2017-09-20 | Disposition: A | Discharge status: Home / Self Care | Payer: Commercial Managed Care - PPO | Source: Ambulatory Visit | Attending: Family Medicine | Admitting: Family Medicine

## 2017-09-20 DIAGNOSIS — Z139 Encounter for screening, unspecified: Secondary | ICD-10-CM

## 2017-09-30 ENCOUNTER — Ambulatory Visit: Payer: Commercial Managed Care - PPO

## 2017-10-25 ENCOUNTER — Ambulatory Visit: Payer: Commercial Managed Care - PPO | Admitting: Physician Assistant

## 2017-10-25 ENCOUNTER — Other Ambulatory Visit: Payer: Self-pay

## 2017-10-25 ENCOUNTER — Encounter: Payer: Self-pay | Admitting: Physician Assistant

## 2017-10-25 VITALS — BP 110/72 | HR 71 | Temp 98.1°F | Resp 14 | Ht 67.0 in | Wt 177.0 lb

## 2017-10-25 DIAGNOSIS — B9689 Other specified bacterial agents as the cause of diseases classified elsewhere: Secondary | ICD-10-CM

## 2017-10-25 DIAGNOSIS — J208 Acute bronchitis due to other specified organisms: Secondary | ICD-10-CM | POA: Diagnosis not present

## 2017-10-25 MED ORDER — BENZONATATE 100 MG PO CAPS: 100.0000 mg | ORAL_CAPSULE | Freq: Two times a day (BID) | ORAL | 0 refills | Status: DC | PRN

## 2017-10-25 MED ORDER — AZITHROMYCIN 250 MG PO TABS: ORAL_TABLET | ORAL | 0 refills | Status: DC

## 2017-10-25 NOTE — Patient Instructions (Signed)
Take antibiotic (Azithromycin) as directed.  Increase fluids.  Get plenty of rest. Use Mucinex for congestion. Tessalon as directed. Try a Claritin-D to help with fluid and inflammation. Take a daily probiotic (I recommend Align or Culturelle, but even Activia Yogurt may be beneficial).  A humidifier placed in the bedroom may offer some relief for a dry, scratchy throat of nasal irritation.  Read information below on acute bronchitis. Please call or return to clinic if symptoms are not improving.  Acute Bronchitis Bronchitis is when the airways that extend from the windpipe into the lungs get red, puffy, and painful (inflamed). Bronchitis often causes thick spit (mucus) to develop. This leads to a cough. A cough is the most common symptom of bronchitis. In acute bronchitis, the condition usually begins suddenly and goes away over time (usually in 2 weeks). Smoking, allergies, and asthma can make bronchitis worse. Repeated episodes of bronchitis may cause more lung problems.  HOME CARE  Rest.  Drink enough fluids to keep your pee (urine) clear or pale yellow (unless you need to limit fluids as told by your doctor).  Only take over-the-counter or prescription medicines as told by your doctor.  Avoid smoking and secondhand smoke. These can make bronchitis worse. If you are a smoker, think about using nicotine gum or skin patches. Quitting smoking will help your lungs heal faster.  Reduce the chance of getting bronchitis again by:  Washing your hands often.  Avoiding people with cold symptoms.  Trying not to touch your hands to your mouth, nose, or eyes.  Follow up with your doctor as told.  GET HELP IF: Your symptoms do not improve after 1 week of treatment. Symptoms include:  Cough.  Fever.  Coughing up thick spit.  Body aches.  Chest congestion.  Chills.  Shortness of breath.  Sore throat.  GET HELP RIGHT AWAY IF:   You have an increased fever.  You have chills.  You  have severe shortness of breath.  You have bloody thick spit (sputum).  You throw up (vomit) often.  You lose too much body fluid (dehydration).  You have a severe headache.  You faint.  MAKE SURE YOU:   Understand these instructions.  Will watch your condition.  Will get help right away if you are not doing well or get worse. Document Released: 03/26/2008 Document Revised: 06/10/2013 Document Reviewed: 03/31/2013 [facility] Patient Information 2015 [place], [place]. This information is not intended to replace advice given to you by your health care provider. Make sure you discuss any questions you have with your health care provider.

## 2017-10-25 NOTE — Progress Notes (Signed)
Patient presents to clinic today c/o 4 weeks of cough and chest congestion worsening over the past week. Denies fever or chills. Cough is persistent and keeping her up at night. Notes sinus drainage and ear pressure. Denies sinus pain, ear pain or tooth pain. Notes environmental allergies. Has only taking Claritin twice since symptoms onset.. Denies recent travel or sic  Past Medical History:  Diagnosis Date  . Anemia   . Anxiety    hx- has gone away since thyroid level controlled  . Family history of ovarian cancer 04/06/2015  . Female pelvic pain 04/06/2015  . Hematometra 05/05/2015  . Hypothyroidism   . S/P BSO (bilateral salpingo-oophorectomy) 05/06/2015  . S/P endometrial ablation 04/06/2015  . S/P laparoscopic assisted vaginal hysterectomy (LAVH) 05/06/2015    Current Outpatient Medications on File Prior to Visit  Medication Sig Dispense Refill  . ALPRAZolam (XANAX) 0.5 MG tablet TAKE 1 TABLET BY MOUTH TWICE A DAY AS NEEDED FOR ANXIETY 60 tablet 1  . Calcium-Vitamin D-Vitamin K (CALCIUM + D) [phone] MG-UNT-MCG CHEW Chew 2 tablets by mouth daily.    . Multiple Vitamins-Minerals (MULTIVITAMIN ADULT PO) Take 1 tablet by mouth 2 (two) times daily.     Marland Kitchen SYNTHROID 150 MCG tablet Take 150 mcg by mouth daily.  12   No current facility-administered medications on file prior to visit.     Allergies  Allergen Reactions  . Benzoin Dermatitis    Blisters, itching, redness, hot sensation with combo of Benzoin and steri strips  . Iodine Solution [Povidone Iodine]     rash  . Oxycodone Itching  . Mouthwashes Other (See Comments)    [facility] mouthwash irritated mouth.    Family History  Problem Relation Age of Onset  . Ovarian cancer Mother   . Mental illness Mother   . Diabetes Mother   . Breast cancer Mother 43  . Heart disease Maternal Grandfather   . Cancer Maternal Grandfather 104       colon  . Cancer Maternal Aunt 76       blood cancer; unknown type  . Breast cancer  Maternal Aunt   . Cancer Maternal Grandmother 62       breast  . Breast cancer Maternal Grandmother 85  . Cancer Maternal Aunt   . Esophageal cancer Neg Hx   . Rectal cancer Neg Hx   . Stomach cancer Neg Hx     Social History   Socioeconomic History  . Marital status: Married    Spouse name: None  . Number of children: None  . Years of education: None  . Highest education level: None  Social Needs  . Financial resource strain: None  . Food insecurity - worry: None  . Food insecurity - inability: None  . Transportation needs - medical: None  . Transportation needs - non-medical: None  Occupational History  . None  Tobacco Use  . Smoking status: Former Smoker    Types: Cigarettes    Last attempt to quit: 10/22/1996    Years since quitting: 21.0  . Smokeless tobacco: Never Used  Substance and Sexual Activity  . Alcohol use: Yes    Alcohol/week: 0.0 oz    Comment: daily- wine  . Drug use: No  . Sexual activity: None  Other Topics Concern  . None  Social History Narrative  . None   Review of Systems - See HPI.  All other ROS are negative.  BP 110/72   Pulse 71   Temp 98.1 F (  36.7 C) (Oral)   Resp 14   Ht 5\' 7"  (1.702 m)   Wt 177 lb (80.3 kg)   LMP 01/18/2014   SpO2 97%   BMI 27.72 kg/m   Physical Exam  Constitutional: She is oriented to person, place, and time and well-developed, well-nourished, and in no distress.  HENT:  Head: Normocephalic and atraumatic.  Right Ear: External ear normal. A middle ear effusion is present.  Left Ear: External ear normal. A middle ear effusion is present.  Nose: Mucosal edema and rhinorrhea present. Right sinus exhibits no maxillary sinus tenderness and no frontal sinus tenderness. Left sinus exhibits no maxillary sinus tenderness and no frontal sinus tenderness.  Mouth/Throat: Uvula is midline, oropharynx is clear and moist and mucous membranes are normal. No oropharyngeal exudate.  Eyes: Conjunctivae are normal.  Neck:  Neck supple.  Cardiovascular: Normal rate, regular rhythm, normal heart sounds and intact distal pulses.  Pulmonary/Chest: Effort normal and breath sounds normal. No respiratory distress. She has no wheezes. She has no rales. She exhibits no tenderness.  Lymphadenopathy:    She has no cervical adenopathy.  Neurological: She is alert and oriented to person, place, and time.  Skin: Skin is warm and dry. No rash noted.  Psychiatric: Affect normal.  Vitals reviewed.  Assessment/Plan: 1. Acute bacterial bronchitis Rx azithromycin.  Increase fluids.  Rest.  Saline nasal spray.  Probiotic.  Mucinex as directed.  Humidifier in bedroom. Tessalon and Claritin-D per discussion.  Call or return to clinic if symptoms are not improving.  - azithromycin (ZITHROMAX) 250 MG tablet; Take 2 tablets on Day 1. Then take 1 tablet daily.  Dispense: 6 tablet; Refill: 0 - benzonatate (TESSALON) 100 MG capsule; Take 1 capsule (100 mg total) by mouth 2 (two) times daily as needed for cough.  Dispense: 20 capsule; Refill: 0   Leeanne Rio, PA-C

## 2018-02-11 ENCOUNTER — Other Ambulatory Visit: Payer: Self-pay | Admitting: Family Medicine

## 2018-02-12 NOTE — Telephone Encounter (Signed)
Last OV 10/25/2017 Fill Date 08/26/2017

## 2018-02-12 NOTE — Telephone Encounter (Signed)
Received and reviewed medication refill request.  Request is appropriate and was approved.  Please see medication orders for details.  

## 2018-05-07 ENCOUNTER — Ambulatory Visit (INDEPENDENT_AMBULATORY_CARE_PROVIDER_SITE_OTHER): Payer: Commercial Managed Care - PPO | Admitting: Orthopaedic Surgery

## 2018-05-07 ENCOUNTER — Ambulatory Visit (INDEPENDENT_AMBULATORY_CARE_PROVIDER_SITE_OTHER): Payer: Commercial Managed Care - PPO

## 2018-05-07 ENCOUNTER — Ambulatory Visit (INDEPENDENT_AMBULATORY_CARE_PROVIDER_SITE_OTHER): Payer: Self-pay

## 2018-05-07 ENCOUNTER — Encounter (INDEPENDENT_AMBULATORY_CARE_PROVIDER_SITE_OTHER): Payer: Self-pay | Admitting: Orthopaedic Surgery

## 2018-05-07 VITALS — BP 111/72 | HR 67 | Ht 66.0 in | Wt 180.0 lb

## 2018-05-07 DIAGNOSIS — M25552 Pain in left hip: Secondary | ICD-10-CM

## 2018-05-07 DIAGNOSIS — M25561 Pain in right knee: Secondary | ICD-10-CM | POA: Diagnosis not present

## 2018-05-07 DIAGNOSIS — G8929 Other chronic pain: Secondary | ICD-10-CM | POA: Insufficient documentation

## 2018-05-07 DIAGNOSIS — M25551 Pain in right hip: Secondary | ICD-10-CM | POA: Diagnosis not present

## 2018-05-07 NOTE — Progress Notes (Signed)
Office Visit Note   Patient: Rebecca Gallegos           Date of Birth: [birth year]           MRN: [id number] Visit Date: 05/07/2018              Requested by: Midge Minium, MD [address] [place], [place] [address] PCP: Midge Minium, MD   Assessment & Plan: Visit Diagnoses:  1. Chronic pain of right knee   2. Bilateral hip pain     Plan: We discussed isometric quadriceps exercises.  She has some chondromalacia patella with symptoms with squatting and stairs. We discussed terminal arc quad exercises.  Isometrics, straight leg raising.  Anti-inflammatories and activity modification.  Recheck 6 weeks. Follow-Up Instructions: No follow-ups on file.   Orders:  Orders Placed This Encounter  Procedures  . XR HIPS BILAT W OR W/O PELVIS 3-4 VIEWS  . XR KNEE 3 VIEW RIGHT   No orders of the defined types were placed in this encounter.     Procedures: No procedures performed   Clinical Data: No additional findings.   Subjective: Chief Complaint  Patient presents with  . Left Hip - Pain  . Right Hip - Pain  . Right Knee - Pain    HPI 52 year old female with history of knee pain for greater than 1 year.  She states her knee hurts when she goes up and down stairs problems with squatting.  Downstairs is worse and upstairs.  She has some bilateral hip pain over the trochanters and some posteriorly.  She states it flared up last week and she had difficulty getting out of bed and at that time she was limping.  She  had previous L4-5 disc protrusion 2009.   Review of Systems 14 point review of systems positive for history of hypothyroidism, anxiety, anemia, previous hysterectomy.  Left foot surgery by Dr. Paulla Dolly, history of lumbar disc protrusion L4-5.  Positive for anxiety depression mononucleosis and thyroid condition.  Previous foot surgery.  Patient quit smoking greater than 20 years ago .   Objective: Vital Signs: BP 111/72   Pulse 67   Ht 5\' 6"  (1.676 m)    Wt 180 lb (81.6 kg)   LMP 01/18/2014   BMI 29.05 kg/m   Physical Exam  Constitutional: She is oriented to person, place, and time. She appears well-developed.  HENT:  Head: Normocephalic.  Right Ear: External ear normal.  Left Ear: External ear normal.  Eyes: Pupils are equal, round, and reactive to light.  Neck: No tracheal deviation present. No thyromegaly present.  Cardiovascular: Normal rate.  Pulmonary/Chest: Effort normal.  Abdominal: Soft.  Neurological: She is alert and oriented to person, place, and time.  Skin: Skin is warm and dry.  Psychiatric: She has a normal mood and affect. Her behavior is normal.    Ortho Exam patient has some pain with patellar compression and quadriceps contracture.  Normal patellar tracking note negative lateral subluxation of the patella.  ACL PCL is normal.  Distal pulses are 2+.  Mild sciatic notch tenderness and mild bilateral trochanteric tenderness worse on the left than right.  Negative straight leg raising 90 degrees.  No hip flexion contracture no rash over exposed skin no pitting edema.  Knee and ankle jerk are intact and symmetrical.  Specialty Comments:  No specialty comments available.  Imaging: No results found.   PMFS History: Patient Active Problem List   Diagnosis Date Noted  .  Chronic pain of right knee 05/07/2018  . Bilateral hip pain 05/07/2018  . Physical exam 07/19/2017  . Pre-diabetes 03/09/2016  . Left foot pain 11/03/2015  . Nasal congestion 11/03/2015  . Bilateral arm pain 11/02/2015  . Bilateral arm numbness and tingling while sleeping 11/02/2015  . S/P laparoscopic assisted vaginal hysterectomy (LAVH) 05/06/2015  . S/P BSO (bilateral salpingo-oophorectomy) 05/06/2015  . Hematometra 05/05/2015  . S/P endometrial ablation 04/06/2015  . Family history of ovarian cancer 04/06/2015  . Female pelvic pain 04/06/2015  . Cerumen impaction 11/28/2014  . Family history of malignant neoplasm of gastrointestinal  tract 03/05/2014  . Family history of malignant neoplasm of breast 03/05/2014  . Cough 11/22/2013  . Irregular menstrual bleeding 07/10/2013  . Hypothyroidism 11/27/2012  . Anxiety 11/27/2012  . Anemia 11/27/2012   Past Medical History:  Diagnosis Date  . Anemia   . Anxiety    hx- has gone away since thyroid level controlled  . Family history of ovarian cancer 04/06/2015  . Female pelvic pain 04/06/2015  . Hematometra 05/05/2015  . Hypothyroidism   . S/P BSO (bilateral salpingo-oophorectomy) 05/06/2015  . S/P endometrial ablation 04/06/2015  . S/P laparoscopic assisted vaginal hysterectomy (LAVH) 05/06/2015    Family History  Problem Relation Age of Onset  . Ovarian cancer Mother   . Mental illness Mother   . Diabetes Mother   . Breast cancer Mother 35  . Heart disease Maternal Grandfather   . Cancer Maternal Grandfather 68       colon  . Cancer Maternal Aunt 61       blood cancer; unknown type  . Breast cancer Maternal Aunt   . Cancer Maternal Grandmother 35       breast  . Breast cancer Maternal Grandmother 85  . Cancer Maternal Aunt   . Esophageal cancer Neg Hx   . Rectal cancer Neg Hx   . Stomach cancer Neg Hx     Past Surgical History:  Procedure Laterality Date  . BACK SURGERY  2009  . CERVICAL SPINE SURGERY    . LAPAROSCOPIC ASSISTED VAGINAL HYSTERECTOMY N/A 05/06/2015   Procedure: LAPAROSCOPIC ASSISTED VAGINAL HYSTERECTOMY;  Surgeon: Janyth Contes, MD;  Location: [facility];  Service: Gynecology;  Laterality: N/A;  . SALPINGOOPHORECTOMY Bilateral 05/06/2015   Procedure: SALPINGO OOPHORECTOMY;  Surgeon: Janyth Contes, MD;  Location: [facility];  Service: Gynecology;  Laterality: Bilateral;  . THYROIDECTOMY    . TUBAL LIGATION  1991   bilateral   Social History   Occupational History  . Not on file  Tobacco Use  . Smoking status: Former Smoker    Types: Cigarettes    Last attempt to quit: 10/22/1996    Years since quitting: 21.5  . Smokeless tobacco:  Never Used  Substance and Sexual Activity  . Alcohol use: Yes    Alcohol/week: 0.0 oz    Comment: daily- wine  . Drug use: No  . Sexual activity: Not on file

## 2018-06-20 ENCOUNTER — Encounter (INDEPENDENT_AMBULATORY_CARE_PROVIDER_SITE_OTHER): Payer: Self-pay | Admitting: Orthopaedic Surgery

## 2018-06-20 ENCOUNTER — Ambulatory Visit (INDEPENDENT_AMBULATORY_CARE_PROVIDER_SITE_OTHER): Payer: Commercial Managed Care - PPO | Admitting: Orthopaedic Surgery

## 2018-06-20 VITALS — BP 112/79 | HR 73 | Ht 66.0 in | Wt 170.0 lb

## 2018-06-20 DIAGNOSIS — M2241 Chondromalacia patellae, right knee: Secondary | ICD-10-CM

## 2018-06-20 NOTE — Progress Notes (Signed)
Office Visit Note   Patient: Rebecca Gallegos           Date of Birth: 1966-04-01           MRN: [id number] Visit Date: 06/20/2018              Requested by: Midge Minium, MD [address] [place], [place] [address] PCP: Midge Minium, MD   Assessment & Plan: Visit Diagnoses:  1. Chondromalacia patellae, right knee     Plan: Patient is responded to conservative treatment.  She will return if she has increased symptoms.  Workout activities to avoid and those sit are unlikely to bother her knee work discussed in detail.  If she gets increased symptoms she can return.  Follow-Up Instructions: Return if symptoms worsen or fail to improve.   Orders:  No orders of the defined types were placed in this encounter.  No orders of the defined types were placed in this encounter.     Procedures: No procedures performed   Clinical Data: No additional findings.   Subjective: Chief Complaint  Patient presents with  . Right Hip - Follow-up  . Left Hip - Follow-up  . Right Knee - Follow-up    HPI 52 year old female returns with follow-up 6 weeks for right knee pain and bilateral hip pain.  She states she is gotten significant improvement in her pain with isometric quad strengthening for chondromalacia symptoms.  She has been on terminal arc quads exercises as well as anti-inflammatories.  She did have one episode where she fell down the steps but did not injure herself significantly.  Review of Systems systems updated unchanged from 05/07/2018, other than as mentioned in HPI.  She is taking Celexa 20 mg daily.  She has lost 10 pounds.   Objective: Vital Signs: BP 112/79   Pulse 73   Ht 5\' 6"  (1.676 m)   Wt 170 lb (77.1 kg)   LMP 01/18/2014   BMI 27.44 kg/m   Physical Exam  Constitutional: She is oriented to person, place, and time. She appears well-developed.  HENT:  Head: Normocephalic.  Right Ear: External ear normal.  Left Ear: External ear normal.    Eyes: Pupils are equal, round, and reactive to light.  Neck: No tracheal deviation present. No thyromegaly present.  Cardiovascular: Normal rate.  Pulmonary/Chest: Effort normal.  Abdominal: Soft.  Neurological: She is alert and oriented to person, place, and time.  Skin: Skin is warm and dry.  Psychiatric: She has a normal mood and affect. Her behavior is normal.    Ortho Exam she has no knee effusion.  She has mild crepitus with knee extension bilaterally.  Negative logroll to the hips.  Knee and ankle jerk are intact.  Normal heel toe gait.  Specialty Comments:  No specialty comments available.  Imaging: No results found.   PMFS History: Patient Active Problem List   Diagnosis Date Noted  . Chronic pain of right knee 05/07/2018  . Bilateral hip pain 05/07/2018  . Physical exam 07/19/2017  . Pre-diabetes 03/09/2016  . Left foot pain 11/03/2015  . Nasal congestion 11/03/2015  . Bilateral arm pain 11/02/2015  . Bilateral arm numbness and tingling while sleeping 11/02/2015  . S/P laparoscopic assisted vaginal hysterectomy (LAVH) 05/06/2015  . S/P BSO (bilateral salpingo-oophorectomy) 05/06/2015  . Hematometra 05/05/2015  . S/P endometrial ablation 04/06/2015  . Family history of ovarian cancer 04/06/2015  . Female pelvic pain 04/06/2015  . Cerumen impaction 11/28/2014  .  Family history of malignant neoplasm of gastrointestinal tract 03/05/2014  . Family history of malignant neoplasm of breast 03/05/2014  . Cough 11/22/2013  . Irregular menstrual bleeding 07/10/2013  . Hypothyroidism 11/27/2012  . Anxiety 11/27/2012  . Anemia 11/27/2012   Past Medical History:  Diagnosis Date  . Anemia   . Anxiety    hx- has gone away since thyroid level controlled  . Family history of ovarian cancer 04/06/2015  . Female pelvic pain 04/06/2015  . Hematometra 05/05/2015  . Hypothyroidism   . S/P BSO (bilateral salpingo-oophorectomy) 05/06/2015  . S/P endometrial ablation 04/06/2015   . S/P laparoscopic assisted vaginal hysterectomy (LAVH) 05/06/2015    Family History  Problem Relation Age of Onset  . Ovarian cancer Mother   . Mental illness Mother   . Diabetes Mother   . Breast cancer Mother 79  . Heart disease Maternal Grandfather   . Cancer Maternal Grandfather 70       colon  . Cancer Maternal Aunt 58       blood cancer; unknown type  . Breast cancer Maternal Aunt   . Cancer Maternal Grandmother 68       breast  . Breast cancer Maternal Grandmother 85  . Cancer Maternal Aunt   . Esophageal cancer Neg Hx   . Rectal cancer Neg Hx   . Stomach cancer Neg Hx     Past Surgical History:  Procedure Laterality Date  . BACK SURGERY  2009  . CERVICAL SPINE SURGERY    . LAPAROSCOPIC ASSISTED VAGINAL HYSTERECTOMY N/A 05/06/2015   Procedure: LAPAROSCOPIC ASSISTED VAGINAL HYSTERECTOMY;  Surgeon: Janyth Contes, MD;  Location: [facility];  Service: Gynecology;  Laterality: N/A;  . SALPINGOOPHORECTOMY Bilateral 05/06/2015   Procedure: SALPINGO OOPHORECTOMY;  Surgeon: Janyth Contes, MD;  Location: [facility];  Service: Gynecology;  Laterality: Bilateral;  . THYROIDECTOMY    . TUBAL LIGATION  1991   bilateral   Social History   Occupational History  . Not on file  Tobacco Use  . Smoking status: Former Smoker    Types: Cigarettes    Last attempt to quit: 10/22/1996    Years since quitting: 21.6  . Smokeless tobacco: Never Used  Substance and Sexual Activity  . Alcohol use: Yes    Alcohol/week: 0.0 standard drinks    Comment: daily- wine  . Drug use: No  . Sexual activity: Not on file

## 2018-06-24 ENCOUNTER — Encounter (INDEPENDENT_AMBULATORY_CARE_PROVIDER_SITE_OTHER): Payer: Self-pay | Admitting: Orthopaedic Surgery

## 2018-07-21 ENCOUNTER — Other Ambulatory Visit: Payer: Self-pay | Admitting: Family Medicine

## 2018-07-21 DIAGNOSIS — Z1231 Encounter for screening mammogram for malignant neoplasm of breast: Secondary | ICD-10-CM

## 2018-07-23 ENCOUNTER — Ambulatory Visit (INDEPENDENT_AMBULATORY_CARE_PROVIDER_SITE_OTHER): Payer: Commercial Managed Care - PPO | Admitting: Family Medicine

## 2018-07-23 ENCOUNTER — Encounter: Payer: Self-pay | Admitting: General Practice

## 2018-07-23 ENCOUNTER — Other Ambulatory Visit: Payer: Self-pay

## 2018-07-23 ENCOUNTER — Encounter: Payer: Self-pay | Admitting: Family Medicine

## 2018-07-23 VITALS — BP 110/79 | HR 59 | Temp 98.1°F | Resp 16 | Ht 66.0 in | Wt 172.4 lb

## 2018-07-23 DIAGNOSIS — K625 Hemorrhage of anus and rectum: Secondary | ICD-10-CM | POA: Insufficient documentation

## 2018-07-23 DIAGNOSIS — E039 Hypothyroidism, unspecified: Secondary | ICD-10-CM

## 2018-07-23 DIAGNOSIS — Z0000 Encounter for general adult medical examination without abnormal findings: Secondary | ICD-10-CM | POA: Diagnosis not present

## 2018-07-23 DIAGNOSIS — R002 Palpitations: Secondary | ICD-10-CM | POA: Diagnosis not present

## 2018-07-23 DIAGNOSIS — Z23 Encounter for immunization: Secondary | ICD-10-CM | POA: Diagnosis not present

## 2018-07-23 LAB — LIPID PANEL
Cholesterol: 198 mg/dL (ref 0–200)
HDL: 55.6 mg/dL (ref >39.00)
LDL Cholesterol: 127 mg/dL — ABNORMAL HIGH (ref 0–99)
NonHDL: 142.4
Total CHOL/HDL Ratio: 4
Triglycerides: 76 mg/dL (ref 0.0–149.0)
VLDL: 15.2 mg/dL (ref 0.0–40.0)

## 2018-07-23 LAB — BASIC METABOLIC PANEL
BUN: 20 mg/dL (ref 6–23)
CHLORIDE: 106 meq/L (ref 96–112)
CO2: 28 meq/L (ref 19–32)
CREATININE: 0.66 mg/dL (ref 0.40–1.20)
Calcium: 9.4 mg/dL (ref 8.4–10.5)
GFR: 99.38 mL/min (ref >60.00)
Glucose, Bld: 109 mg/dL — ABNORMAL HIGH (ref 70–99)
POTASSIUM: 4.8 meq/L (ref 3.5–5.1)
Sodium: 141 mEq/L (ref 135–145)

## 2018-07-23 LAB — CBC WITH DIFFERENTIAL/PLATELET
BASOS PCT: 1.1 % (ref 0.0–3.0)
Basophils Absolute: 0.1 10*3/uL (ref 0.0–0.1)
EOS ABS: 0.3 10*3/uL (ref 0.0–0.7)
Eosinophils Relative: 5.2 % — ABNORMAL HIGH (ref 0.0–5.0)
HCT: 40.2 % (ref 36.0–46.0)
Hemoglobin: 13.5 g/dL (ref 12.0–15.0)
LYMPHS ABS: 1.8 10*3/uL (ref 0.7–4.0)
Lymphocytes Relative: 35.7 % (ref 12.0–46.0)
MCHC: 33.7 g/dL (ref 30.0–36.0)
MCV: 85.5 fl (ref 78.0–100.0)
MONO ABS: 0.4 10*3/uL (ref 0.1–1.0)
Monocytes Relative: 8.1 % (ref 3.0–12.0)
NEUTROS ABS: 2.5 10*3/uL (ref 1.4–7.7)
NEUTROS PCT: 49.9 % (ref 43.0–77.0)
PLATELETS: 215 10*3/uL (ref 150.0–400.0)
RBC: 4.7 Mil/uL (ref 3.87–5.11)
RDW: 13.5 % (ref 11.5–15.5)
WBC: 5 10*3/uL (ref 4.0–10.5)

## 2018-07-23 LAB — TSH: TSH: 1.79 u[IU]/mL (ref 0.35–4.50)

## 2018-07-23 LAB — HEPATIC FUNCTION PANEL
ALK PHOS: 42 U/L (ref 39–117)
ALT: 15 U/L (ref 0–35)
AST: 14 U/L (ref 0–37)
Albumin: 4.4 g/dL (ref 3.5–5.2)
BILIRUBIN DIRECT: 0.1 mg/dL (ref 0.0–0.3)
Total Bilirubin: 0.5 mg/dL (ref 0.2–1.2)
Total Protein: 7.1 g/dL (ref 6.0–8.3)

## 2018-07-23 MED ORDER — CITALOPRAM HYDROBROMIDE 40 MG PO TABS
40.0000 mg | ORAL_TABLET | Freq: Every day | ORAL | 3 refills | Status: DC
Start: 2018-07-23 — End: 2019-02-09

## 2018-07-23 MED ORDER — HYDROCORTISONE ACE-PRAMOXINE 2.5-1 % RE CREA
1.0000 | TOPICAL_CREAM | Freq: Three times a day (TID) | RECTAL | 0 refills | Status: DC
Start: 2018-07-23 — End: 2018-10-21

## 2018-07-23 NOTE — Patient Instructions (Signed)
Follow up in 4-6 weeks to recheck mood We'll notify you of your lab results and make any changes if needed Continue to work on healthy diet and regular exercise- you can do it! INCREASE the Citalopram to 40mg  daily LIMIT your caffeine intake Log your chest palpitations/tightness so we can determine if there are any specific triggers.  Note when they occur, how long they last, what you are doing, related to food or not, etc USE the Hydrocortisone suppositories as directed- if the bleeding continues, let us know so you can go back to GI Call with any questions or concerns Hang in there!!!

## 2018-07-23 NOTE — Assessment & Plan Note (Signed)
Pt's PE WNL.  UTD on colonoscopy, GYN, Tdap.  Flu given.  Check labs.  Anticipatory guidance provided.

## 2018-07-23 NOTE — Progress Notes (Signed)
   Subjective:    Patient ID: Rebecca Gallegos, female    DOB: 1966-07-26, 52 y.o.   MRN: [id number]  HPI CPE- UTD on mammo, colonoscopy, Tdap.  Due for flu today.  No need for pap due to hysterectomy.    Pt notes CP, palpitations, SOB- 'these weird sensations in my chest'.  sxs started a few months.  Occurs both at rest and while up and about.  Some improvement since she eliminated Diet Coke.  Has very high stress level.  Pt admits to worsening anxiety and is interested in increasing Celexa to 40mg  daily.  Review of Systems Patient reports no vision/ hearing changes, adenopathy,fever, weight change,  persistant/recurrent hoarseness , swallowing issues, edema, persistant/recurrent cough, hemoptysis, dyspnea (rest/exertional/paroxysmal nocturnal), abdominal pain, significant heartburn, bowel changes, GU symptoms (dysuria, hematuria, incontinence), Gyn symptoms (abnormal  bleeding, pain),  syncope, focal weakness, memory loss, numbness & tingling, skin/hair/nail changes, abnormal bruising or bleeding.   + BRBPR- occurring w/ BMs.  Will note spots of blood in underwear.  Hx of hemorrhoids.  Having pain, itching, burning.  + constipation.    Objective:   Physical Exam General Appearance:    Alert, cooperative, no distress, appears stated age  Head:    Normocephalic, without obvious abnormality, atraumatic  Eyes:    PERRL, conjunctiva/corneas clear, EOM's intact, fundi    benign, both eyes  Ears:    Normal TM's and external ear canals, both ears  Nose:   Nares normal, septum midline, mucosa normal, no drainage    or sinus tenderness  Throat:   Lips, mucosa, and tongue normal; teeth and gums normal  Neck:   Supple, symmetrical, trachea midline, no adenopathy;    Thyroid: no enlargement/tenderness/nodules  Back:     Symmetric, no curvature, ROM normal, no CVA tenderness  Lungs:     Clear to auscultation bilaterally, respirations unlabored  Chest Wall:    No tenderness or deformity   Heart:     Regular rate and rhythm, S1 and S2 normal, no murmur, rub   or gallop  Breast Exam:    Deferred to GYN  Abdomen:     Soft, non-tender, bowel sounds active all four quadrants,    no masses, no organomegaly  Genitalia:    Deferred to GYN  Rectal:    No external hemorrhoids or skin tags visible, normal rectal tone.  Firm stool pellets in vault.  No TTP over DRE. Suspected hemorrhoid on side wall but difficult to assess due to stool volume.  Extremities:   Extremities normal, atraumatic, no cyanosis or edema  Pulses:   2+ and symmetric all extremities  Skin:   Skin color, texture, turgor normal, no rashes or lesions  Lymph nodes:   Cervical, supraclavicular, and axillary nodes normal  Neurologic:   CNII-XII intact, normal strength, sensation and reflexes    throughout          Assessment & Plan:

## 2018-07-23 NOTE — Assessment & Plan Note (Signed)
Chronic problem.  Has been following w/ Dr Chalmers Cater.  Given recent palpitations, will check labs and adjust meds prn.

## 2018-07-23 NOTE — Assessment & Plan Note (Signed)
New.  Pt reports multiple episodes of palpitations that result in chest tightness, and some SOB.  Occur both at rest and w/ activity.  Pt reports some improvement in sxs since stopping Diet Coke.  Has been under considerable stress.  EKG WNL.  Check labs to assess for metabolic cause.  Suspect this is stress related.

## 2018-07-23 NOTE — Assessment & Plan Note (Signed)
New.  Pt had normal colonoscopy 2016 w/ Dr Hilarie Fredrickson.  She was told by GYN that she had large hemorrhoid.  Has recently been constipated and again has had bleeding.  + pain, itching, burning.  Will start Analpram to improve hemorrhoids.  If no improvement in bleeding, will need to return to GI.  Pt expressed understanding and is in agreement w/ plan.

## 2018-08-04 ENCOUNTER — Other Ambulatory Visit: Payer: Self-pay | Admitting: Family Medicine

## 2018-08-05 NOTE — Telephone Encounter (Signed)
Last Filled: 02/12/18  #60, 1 Last OV: 07/23/18

## 2018-08-25 ENCOUNTER — Ambulatory Visit: Payer: Commercial Managed Care - PPO | Admitting: Family Medicine

## 2018-08-25 ENCOUNTER — Telehealth: Payer: Self-pay | Admitting: Family Medicine

## 2018-08-25 NOTE — Telephone Encounter (Signed)
Paperwork placed in PCP folder for signature.  

## 2018-08-25 NOTE — Telephone Encounter (Signed)
Husband dropped off Health provider form, placed in bin upfront with a charge sheet.

## 2018-08-26 NOTE — Telephone Encounter (Signed)
Form completed and placed in basket  

## 2018-08-26 NOTE — Telephone Encounter (Signed)
Pt's husband aware, he will come pick up the forms tomorrow.

## 2018-08-26 NOTE — Telephone Encounter (Signed)
FYI

## 2018-09-11 ENCOUNTER — Ambulatory Visit: Payer: Commercial Managed Care - PPO | Admitting: Family Medicine

## 2018-09-22 ENCOUNTER — Ambulatory Visit
Admission: RE | Admit: 2018-09-22 | Discharge: 2018-09-22 | Disposition: A | Discharge status: Home / Self Care | Payer: Commercial Managed Care - PPO | Source: Ambulatory Visit | Attending: Family Medicine | Admitting: Family Medicine

## 2018-09-22 DIAGNOSIS — Z1231 Encounter for screening mammogram for malignant neoplasm of breast: Secondary | ICD-10-CM

## 2018-10-21 ENCOUNTER — Other Ambulatory Visit: Payer: Self-pay

## 2018-10-21 ENCOUNTER — Encounter: Payer: Self-pay | Admitting: Family Medicine

## 2018-10-21 ENCOUNTER — Ambulatory Visit (INDEPENDENT_AMBULATORY_CARE_PROVIDER_SITE_OTHER): Payer: Commercial Managed Care - PPO | Admitting: Family Medicine

## 2018-10-21 VITALS — BP 112/81 | HR 74 | Temp 98.2°F | Resp 16 | Ht 66.0 in | Wt 172.5 lb

## 2018-10-21 DIAGNOSIS — F419 Anxiety disorder, unspecified: Secondary | ICD-10-CM | POA: Diagnosis not present

## 2018-10-21 DIAGNOSIS — K649 Unspecified hemorrhoids: Secondary | ICD-10-CM | POA: Diagnosis not present

## 2018-10-21 NOTE — Assessment & Plan Note (Signed)
Ongoing issue for pt.  She is not interested in GI referral at this time.  She has a lot of traveling for work upcoming and will consider reaching out when her schedule lightens.  Discussed hydrocortisone suppositories and OTC treatments

## 2018-10-21 NOTE — Progress Notes (Signed)
   Subjective:    Patient ID: Rebecca Gallegos, female    DOB: Nov 27, 1965, 52 y.o.   MRN: [id number]  HPI Anxiety- at last visit, we increased Citalopram to 40mg  daily.  Just got back from visiting granddaughter in [place].  Pt feels 40mg  dose is good.  Work is stressful which is hard for pt- she felt great on vacation but felt badly again when she returned to work.  Taking 1/2 Xanax to sleep nightly.  Not interested in changing meds at this time.  Hemorrhoids- ongoing issue for pt.  sxs will wax and wane.  Improved w/ hydrocortisone suppositories.  Has a lot of traveling upcoming but may want GI referral after that.   Review of Systems For ROS see HPI     Objective:   Physical Exam Vitals signs reviewed.  Constitutional:      Appearance: Normal appearance.  HENT:     Head: Normocephalic and atraumatic.  Eyes:     Extraocular Movements: Extraocular movements intact.     Conjunctiva/sclera: Conjunctivae normal.     Pupils: Pupils are equal, round, and reactive to light.  Skin:    General: Skin is warm and dry.  Neurological:     General: No focal deficit present.     Mental Status: She is alert and oriented to person, place, and time.  Psychiatric:        Mood and Affect: Mood normal.        Behavior: Behavior normal.        Thought Content: Thought content normal.           Assessment & Plan:

## 2018-10-21 NOTE — Patient Instructions (Signed)
Schedule your complete physical for October No changes at this time.  You look great! Make 2020 the year of you!  Less work stress, more exercise- you've got this! Call with any questions or concerns Happy New Year!!!

## 2018-10-21 NOTE — Assessment & Plan Note (Signed)
Improved since increasing Citalopram.  No med changes at this time.  Will follow.

## 2018-11-03 DIAGNOSIS — N611 Abscess of the breast and nipple: Secondary | ICD-10-CM | POA: Diagnosis not present

## 2018-11-03 DIAGNOSIS — E890 Postprocedural hypothyroidism: Secondary | ICD-10-CM | POA: Diagnosis not present

## 2018-11-03 DIAGNOSIS — B999 Unspecified infectious disease: Secondary | ICD-10-CM | POA: Diagnosis not present

## 2018-11-03 DIAGNOSIS — R7301 Impaired fasting glucose: Secondary | ICD-10-CM | POA: Diagnosis not present

## 2018-11-05 DIAGNOSIS — E890 Postprocedural hypothyroidism: Secondary | ICD-10-CM | POA: Diagnosis not present

## 2018-11-05 DIAGNOSIS — R7301 Impaired fasting glucose: Secondary | ICD-10-CM | POA: Diagnosis not present

## 2018-11-08 DIAGNOSIS — R7303 Prediabetes: Secondary | ICD-10-CM | POA: Diagnosis not present

## 2018-11-24 DIAGNOSIS — R7303 Prediabetes: Secondary | ICD-10-CM | POA: Diagnosis not present

## 2018-11-28 ENCOUNTER — Ambulatory Visit: Payer: Commercial Managed Care - PPO | Admitting: Family Medicine

## 2018-11-28 ENCOUNTER — Ambulatory Visit: Payer: Self-pay

## 2018-11-28 ENCOUNTER — Encounter: Payer: Self-pay | Admitting: General Practice

## 2018-11-28 ENCOUNTER — Other Ambulatory Visit: Payer: Self-pay

## 2018-11-28 ENCOUNTER — Encounter: Payer: Self-pay | Admitting: Family Medicine

## 2018-11-28 VITALS — BP 106/78 | HR 64 | Temp 98.4°F | Resp 16 | Ht 66.0 in | Wt 167.2 lb

## 2018-11-28 DIAGNOSIS — R05 Cough: Secondary | ICD-10-CM | POA: Diagnosis not present

## 2018-11-28 DIAGNOSIS — R058 Other specified cough: Secondary | ICD-10-CM

## 2018-11-28 MED ORDER — ALBUTEROL SULFATE (2.5 MG/3ML) 0.083% IN NEBU
2.5000 mg | INHALATION_SOLUTION | Freq: Once | RESPIRATORY_TRACT | Status: AC
  Administered 2018-11-28: 2.5 mg via RESPIRATORY_TRACT

## 2018-11-28 MED ORDER — ALBUTEROL SULFATE HFA 108 (90 BASE) MCG/ACT IN AERS: 2.0000 | INHALATION_SPRAY | Freq: Four times a day (QID) | RESPIRATORY_TRACT | 2 refills | Status: DC | PRN

## 2018-11-28 MED ORDER — PREDNISONE 10 MG PO TABS: ORAL_TABLET | ORAL | 0 refills | Status: DC

## 2018-11-28 MED ORDER — PROMETHAZINE-DM 6.25-15 MG/5ML PO SYRP: 5.0000 mL | ORAL_SOLUTION | Freq: Four times a day (QID) | ORAL | 0 refills | Status: DC | PRN

## 2018-11-28 NOTE — Telephone Encounter (Signed)
Pt called with question.  She would like to know if her cough and symptoms were contagious. Pt was advised that all cough and viral infections are contagious. Practice good cough hygiene/hand hygiene.   Reason for Disposition . General information question, no triage required and triager able to answer question  Answer Assessment - Initial Assessment Questions 1. REASON FOR CALL or QUESTION: "What is your reason for calling today?" or "How can I best help you?" or "What question do you have that I can help answer?"     Pt want to know if her cough is contagious. 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  Protocols used: INFORMATION ONLY CALL-A-AH

## 2018-11-28 NOTE — Progress Notes (Signed)
   Subjective:    Patient ID: Rebecca Gallegos, female    DOB: Jan 19, 1966, 53 y.o.   MRN: [id number]  HPI URI- sxs started ~2 weeks ago w/ cough.  Cough is productive, keeping her awake at night.  Sore throat from cough.  + SOB.  No fever.  Intermittent wheezing.  + nasal congestion and drainage but no sinus pressure.  No known sick contacts but she has been in '10 airplanes in 12 days'.  No N/V.   Review of Systems For ROS see HPI     Objective:   Physical Exam Vitals signs reviewed.  Constitutional:      General: She is not in acute distress.    Appearance: She is well-developed.  HENT:     Head: Normocephalic and atraumatic.     Right Ear: Tympanic membrane normal.     Left Ear: Tympanic membrane normal.     Nose: Mucosal edema and rhinorrhea present.     Right Sinus: No maxillary sinus tenderness or frontal sinus tenderness.     Left Sinus: No maxillary sinus tenderness or frontal sinus tenderness.     Mouth/Throat:     Pharynx: Posterior oropharyngeal erythema (w/ PND) present.  Eyes:     Conjunctiva/sclera: Conjunctivae normal.     Pupils: Pupils are equal, round, and reactive to light.  Neck:     Musculoskeletal: Normal range of motion and neck supple.  Cardiovascular:     Rate and Rhythm: Normal rate and regular rhythm.     Heart sounds: Normal heart sounds.  Pulmonary:     Effort: Pulmonary effort is normal. No respiratory distress.     Breath sounds: Normal breath sounds. No wheezing or rales.     Comments: Deep hacking cough- improved s/p neb Lymphadenopathy:     Cervical: No cervical adenopathy.  Neurological:     General: No focal deficit present.     Mental Status: She is alert and oriented to person, place, and time.           Assessment & Plan:  Post-viral cough- new.  Pt's sxs and lack of infxn on PE are consistent w/ post-infectious cough.  Sxs improved s/p neb tx in office.  Start Prednisone to improve inflammation and albuterol PRN.  Reviewed  supportive care and red flags that should prompt return.  Pt expressed understanding and is in agreement w/ plan.

## 2018-11-28 NOTE — Patient Instructions (Signed)
Follow up as needed This is a post-infectious cough and should improve w/ time START the Prednisone as directed- 3 tabs at the same time x3 days, then 2 tabs at the same time x3 days, then 1 tab daily.  Take w/ food Drink LOTS of fluids USE the Albuterol inhaler- 2 puffs every 4 hrs as needed for cough, shortness of breath, or wheezing Call with any questions or concerns Hang in there!!!

## 2018-12-15 DIAGNOSIS — Z86018 Personal history of other benign neoplasm: Secondary | ICD-10-CM | POA: Diagnosis not present

## 2018-12-15 DIAGNOSIS — Z872 Personal history of diseases of the skin and subcutaneous tissue: Secondary | ICD-10-CM | POA: Diagnosis not present

## 2018-12-15 DIAGNOSIS — L578 Other skin changes due to chronic exposure to nonionizing radiation: Secondary | ICD-10-CM | POA: Diagnosis not present

## 2018-12-17 DIAGNOSIS — E890 Postprocedural hypothyroidism: Secondary | ICD-10-CM | POA: Diagnosis not present

## 2019-01-04 DIAGNOSIS — R7303 Prediabetes: Secondary | ICD-10-CM | POA: Diagnosis not present

## 2019-01-05 ENCOUNTER — Encounter: Payer: Self-pay | Admitting: Family Medicine

## 2019-01-06 MED ORDER — ALPRAZOLAM 0.5 MG PO TABS: ORAL_TABLET | ORAL | 1 refills | Status: DC

## 2019-01-08 ENCOUNTER — Telehealth: Payer: Self-pay

## 2019-01-08 ENCOUNTER — Telehealth: Payer: Self-pay | Admitting: Emergency Medicine

## 2019-01-08 DIAGNOSIS — R6889 Other general symptoms and signs: Secondary | ICD-10-CM

## 2019-01-08 NOTE — Telephone Encounter (Signed)
Patient called reporting cough x 1 week, occasional SOB, tired and weak. Patient does not have thermometer at home, has experienced chills this week. 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   TELEPHONE SCREENING  Coronavirus 01/08/2019  Cecille Rubin Goecke    HQR:[id number]    DOB:(1966-03-21)  1. Symptoms: chills, HA and non productive cough, shortness of breath (highlight symptoms for coronavirus is very high fever, cough, shortness of breath)  2. Symptom Onset: 1week(s)  3. Have you traveled any over the past 14 days? Yes  a. Where did you travel from:[place] (affected geographic areas is [place], [place], [place], [place], [place] ([place], [place], [place])   4.    Have you been in contact with anyone that is suspected of having Coronavirus? Unsure, works with international travelers   Per Dr. Birdie Riddle, advised patient to go to [facility] [place].  Documents sent to patient via MyChart.

## 2019-01-09 ENCOUNTER — Encounter (INDEPENDENT_AMBULATORY_CARE_PROVIDER_SITE_OTHER): Payer: Commercial Managed Care - PPO | Admitting: Family Medicine

## 2019-01-09 ENCOUNTER — Telehealth: Payer: Self-pay | Admitting: Family Medicine

## 2019-01-09 DIAGNOSIS — H02846 Edema of left eye, unspecified eyelid: Secondary | ICD-10-CM

## 2019-01-09 NOTE — Telephone Encounter (Signed)
This was responded to via MyChart

## 2019-01-09 NOTE — Telephone Encounter (Signed)
Called and spoke with pt. She advised that she woke up yesterday and her eye was almost always swollen shut. Edema extended to upper orbital bone and into cheekbone. Pt states that eye is weepy, clear discharge, there is a yellow tinge to the corner of the eye and some crustiness, and itchiness on occasion.   Pt is taking a picture and sending to PCP in mychart for evaluation.

## 2019-01-09 NOTE — Telephone Encounter (Signed)
Copied from [place] [id number]. Topic: Quick Communication - See Telephone Encounter >> Jan 09, 2019  8:33 AM Burchel, Abbi R wrote: CRM for notification. See Telephone encounter for: 01/09/19.  Pt states she was instructed to be tested for COVID-19 which she has since done, but she is still experiencing swelling around her left eye and wanted to know if she could schedule an e-visit or tele-visit to attempt to get that treated.  Please call pt to advise, asap.   [phone]

## 2019-01-09 NOTE — Telephone Encounter (Signed)
Cumulative time- 8 minutes Consent- pt called asking for advice and then emailed pictures to assess People Involved- Pt, Jessica Brodmerkel CMA, Annye Asa MD CC- L eye swelling Hx- pt called indicating that she had L eye swelling w/ some drainage and itchiness that started yesterday AP- Allergic eye swelling.  No evidence of cellulitis, no need for abx.  Encouraged daily antihistamine w/ addition of OTC allergy eye drop.

## 2019-01-15 ENCOUNTER — Telehealth: Payer: Self-pay | Admitting: Family Medicine

## 2019-01-15 NOTE — Telephone Encounter (Signed)
Spoke with patient - advised that we do not have these results yet.  We will call as soon as we have them.  Patient stated verbal understanding and thanked me for the call.

## 2019-01-15 NOTE — Telephone Encounter (Signed)
Copied from [place] [id number]. Topic: Quick Communication - See Telephone Encounter >> Jan 15, 2019 12:10 PM Bea Graff, NT wrote: CRM for notification. See Telephone encounter for: 01/15/19. Pt checking status on her COVID-19 results.

## 2019-01-19 ENCOUNTER — Encounter: Payer: Self-pay | Admitting: *Deleted

## 2019-01-19 ENCOUNTER — Encounter: Payer: Self-pay | Admitting: Family Medicine

## 2019-01-19 LAB — NOVEL CORONAVIRUS, NAA: SARS-CoV-2, NAA: NOT DETECTED

## 2019-01-19 NOTE — Telephone Encounter (Signed)
Pt called to check status of COVID19 testing results. Please call back.

## 2019-01-19 NOTE — Telephone Encounter (Signed)
Results are not back yet.  I have sent patient a mychart message to inform her.

## 2019-02-04 DIAGNOSIS — R7303 Prediabetes: Secondary | ICD-10-CM | POA: Diagnosis not present

## 2019-02-07 ENCOUNTER — Encounter: Payer: Self-pay | Admitting: Family Medicine

## 2019-02-09 ENCOUNTER — Other Ambulatory Visit: Payer: Self-pay | Admitting: *Deleted

## 2019-02-09 MED ORDER — CITALOPRAM HYDROBROMIDE 40 MG PO TABS: 40.0000 mg | ORAL_TABLET | Freq: Every day | ORAL | 1 refills | Status: DC

## 2019-05-03 ENCOUNTER — Other Ambulatory Visit: Payer: Self-pay | Admitting: Family Medicine

## 2019-05-04 ENCOUNTER — Encounter: Payer: Self-pay | Admitting: Podiatry

## 2019-05-04 ENCOUNTER — Ambulatory Visit (INDEPENDENT_AMBULATORY_CARE_PROVIDER_SITE_OTHER): Payer: Commercial Managed Care - PPO

## 2019-05-04 ENCOUNTER — Ambulatory Visit: Payer: Commercial Managed Care - PPO | Admitting: Podiatry

## 2019-05-04 ENCOUNTER — Other Ambulatory Visit: Payer: Self-pay | Admitting: Podiatry

## 2019-05-04 ENCOUNTER — Other Ambulatory Visit: Payer: Self-pay

## 2019-05-04 VITALS — Temp 98.6°F

## 2019-05-04 DIAGNOSIS — M722 Plantar fascial fibromatosis: Secondary | ICD-10-CM

## 2019-05-04 DIAGNOSIS — M79671 Pain in right foot: Secondary | ICD-10-CM

## 2019-05-04 MED ORDER — METHYLPREDNISOLONE 4 MG PO TBPK: ORAL_TABLET | ORAL | 0 refills | Status: DC

## 2019-05-04 MED ORDER — MELOXICAM 15 MG PO TABS: 15.0000 mg | ORAL_TABLET | Freq: Every day | ORAL | 1 refills | Status: DC

## 2019-05-04 NOTE — Telephone Encounter (Signed)
Last OV 11/28/18 Alprazolam last filled 01/06/19 #60 with 1

## 2019-05-05 NOTE — Progress Notes (Signed)
   Subjective: 53 y.o. female presenting today with a chief complaint of right heel pain that began two weeks ago after an injury. She states she fell on her hardwood floor and struck the floor with her heel really hard. She reports h/o plantar fasciitis but states this pain feels different. She states the pain is improving but not resolved. She has not had any treatment for the injury. Patient is here for further evaluation and treatment.  Past Medical History:  Diagnosis Date  . Anemia   . Anxiety    hx- has gone away since thyroid level controlled  . Family history of ovarian cancer 04/06/2015  . Female pelvic pain 04/06/2015  . Hematometra 05/05/2015  . Hypothyroidism   . S/P BSO (bilateral salpingo-oophorectomy) 05/06/2015  . S/P endometrial ablation 04/06/2015  . S/P laparoscopic assisted vaginal hysterectomy (LAVH) 05/06/2015     Objective: Physical Exam General: The patient is alert and oriented x3 in no acute distress.  Dermatology: Skin is warm, dry and supple bilateral lower extremities. Negative for open lesions or macerations bilateral.   Vascular: Dorsalis Pedis and Posterior Tibial pulses palpable bilateral.  Capillary fill time is immediate to all digits.  Neurological: Epicritic and protective threshold intact bilateral.   Musculoskeletal: Tenderness to palpation to the plantar aspect of the right heel along the plantar fascia. All other joints range of motion within normal limits bilateral. Strength 5/5 in all groups bilateral.   Radiographic exam: Normal osseous mineralization. Joint spaces preserved. No fracture/dislocation/boney destruction. No other soft tissue abnormalities or radiopaque foreign bodies.   Assessment: 1. Plantar fasciitis right secondary to mechanical fall injury   Plan of Care:  1. Patient evaluated. Xrays reviewed.   2. Injection of 0.5cc Celestone soluspan injected into the right plantar fascia  3. Rx for Medrol Dose Pack placed 4. Rx for  Meloxicam ordered for patient. 5. Recommended good shoe gear.  6. Instructed patient regarding therapies and modalities at home to alleviate symptoms.  7. Return to clinic as needed.   Goes by [employer].    Edrick Kins, DPM [facility]  Dr. Edrick Kins, DPM    [address], [place] [address]                Office [phone]  Fax [phone]

## 2019-07-13 ENCOUNTER — Other Ambulatory Visit: Payer: Self-pay

## 2019-07-13 ENCOUNTER — Ambulatory Visit (INDEPENDENT_AMBULATORY_CARE_PROVIDER_SITE_OTHER): Payer: Commercial Managed Care - PPO

## 2019-07-13 DIAGNOSIS — Z23 Encounter for immunization: Secondary | ICD-10-CM

## 2019-07-21 ENCOUNTER — Encounter: Payer: Self-pay | Admitting: Family Medicine

## 2019-07-21 DIAGNOSIS — H9319 Tinnitus, unspecified ear: Secondary | ICD-10-CM

## 2019-07-25 ENCOUNTER — Other Ambulatory Visit: Payer: Self-pay | Admitting: Family Medicine

## 2019-08-03 ENCOUNTER — Telehealth: Payer: Self-pay | Admitting: *Deleted

## 2019-08-03 MED ORDER — MELOXICAM 15 MG PO TABS: 15.0000 mg | ORAL_TABLET | Freq: Every day | ORAL | 2 refills | Status: DC

## 2019-08-03 NOTE — Telephone Encounter (Signed)
Called and spoke with the patient and stated that we had the meloxicam refilled at the pharmacy and the patient stated that she has not taken that in months and does not want a refill on it and I called the pharmacy to cancel the refill and spoke with the pharmacist and they cancelled the order. Lattie Haw

## 2019-08-03 NOTE — Addendum Note (Signed)
Addended by: Cranford Mon R on: 08/03/2019 10:18 AM   Modules accepted: Orders

## 2019-08-29 ENCOUNTER — Other Ambulatory Visit: Payer: Self-pay | Admitting: General Practice

## 2019-08-29 NOTE — Telephone Encounter (Signed)
Last OV 11/28/18 Alprazolam last filled 05/04/19 #60 with 1

## 2019-09-01 MED ORDER — ALPRAZOLAM 0.5 MG PO TABS: ORAL_TABLET | ORAL | 1 refills | Status: DC

## 2019-09-14 ENCOUNTER — Other Ambulatory Visit: Payer: Self-pay | Admitting: Family Medicine

## 2019-09-14 DIAGNOSIS — Z1231 Encounter for screening mammogram for malignant neoplasm of breast: Secondary | ICD-10-CM

## 2019-09-16 ENCOUNTER — Other Ambulatory Visit: Payer: Self-pay

## 2019-10-13 ENCOUNTER — Ambulatory Visit: Payer: Commercial Managed Care - PPO | Attending: Internal Medicine

## 2019-10-13 DIAGNOSIS — Z20822 Contact with and (suspected) exposure to covid-19: Secondary | ICD-10-CM

## 2019-10-14 LAB — NOVEL CORONAVIRUS, NAA: SARS-CoV-2, NAA: NOT DETECTED

## 2019-10-30 LAB — BASIC METABOLIC PANEL
BUN: 21 (ref 4–21)
Chloride: 103 (ref 99–108)
Creatinine: 0.7 (ref 0.5–1.1)
Glucose: 64
Potassium: 4.7 (ref 3.4–5.3)
Sodium: 140 (ref 137–147)

## 2019-10-30 LAB — HEMOGLOBIN A1C: Hemoglobin A1C: 5.5

## 2019-10-30 LAB — TSH: TSH: 0.1 — AB (ref 0.41–5.90)

## 2019-11-03 ENCOUNTER — Other Ambulatory Visit: Payer: Self-pay

## 2019-11-03 ENCOUNTER — Ambulatory Visit
Admission: RE | Admit: 2019-11-03 | Discharge: 2019-11-03 | Disposition: A | Discharge status: Home / Self Care | Payer: Commercial Managed Care - PPO | Source: Ambulatory Visit | Attending: Family Medicine | Admitting: Family Medicine

## 2019-11-03 DIAGNOSIS — Z1231 Encounter for screening mammogram for malignant neoplasm of breast: Secondary | ICD-10-CM

## 2019-11-03 LAB — HM MAMMOGRAPHY: HM Mammogram: NORMAL (ref 0–4)

## 2019-11-04 ENCOUNTER — Other Ambulatory Visit: Payer: Self-pay

## 2019-11-04 ENCOUNTER — Ambulatory Visit (INDEPENDENT_AMBULATORY_CARE_PROVIDER_SITE_OTHER): Payer: Commercial Managed Care - PPO | Admitting: Family Medicine

## 2019-11-04 ENCOUNTER — Encounter: Payer: Self-pay | Admitting: Family Medicine

## 2019-11-04 VITALS — Temp 97.9°F | Ht 66.0 in | Wt 174.0 lb

## 2019-11-04 DIAGNOSIS — J45991 Cough variant asthma: Secondary | ICD-10-CM | POA: Diagnosis not present

## 2019-11-04 MED ORDER — QVAR REDIHALER 80 MCG/ACT IN AERB: 2.0000 | INHALATION_SPRAY | Freq: Two times a day (BID) | RESPIRATORY_TRACT | 3 refills | Status: DC

## 2019-11-04 MED ORDER — ALBUTEROL SULFATE HFA 108 (90 BASE) MCG/ACT IN AERS: 2.0000 | INHALATION_SPRAY | Freq: Four times a day (QID) | RESPIRATORY_TRACT | 3 refills | Status: DC | PRN

## 2019-11-04 NOTE — Progress Notes (Signed)
Virtual Visit via Video   I connected with patient on 11/04/19 at  9:00 AM EST by a video enabled telemedicine application and verified that I am speaking with the correct person using two identifiers.  Location patient: Home Location provider: Acupuncturist, Office Persons participating in the virtual visit: Patient, Provider, [place] (Jess B)  I discussed the limitations of evaluation and management by telemedicine and the availability of in person appointments. The patient expressed understanding and agreed to proceed.  Subjective:   HPI:   Cough- pt reports she has had the cough for ~1 yr.  Saw ENT in the fall for tinnitus and was started on Flonase and lipoflavinoids w/o relief of cough.  Some relief of cough w/ inhaler.  Having some nasal congestion and when blowing her nose she notes blood.  No facial pain/pressure.  No fevers.  + mild SOB.  Pt reports she 'feels fine' when not coughing.  Can can be both dry and productive.  Taking Claritin daily.  No wheezing or chest tightness.  No nocturnal cough.  Remote hx of smoking.    ROS:   See pertinent positives and negatives per HPI.  Patient Active Problem List   Diagnosis Date Noted  . Bleeding hemorrhoid 10/21/2018  . Palpitations 07/23/2018  . Chronic pain of right knee 05/07/2018  . Bilateral hip pain 05/07/2018  . Physical exam 07/19/2017  . Pre-diabetes 03/09/2016  . S/P laparoscopic assisted vaginal hysterectomy (LAVH) 05/06/2015  . S/P BSO (bilateral salpingo-oophorectomy) 05/06/2015  . S/P endometrial ablation 04/06/2015  . Family history of ovarian cancer 04/06/2015  . Family history of malignant neoplasm of gastrointestinal tract 03/05/2014  . Family history of malignant neoplasm of breast 03/05/2014  . Hypothyroidism 11/27/2012  . Anxiety 11/27/2012  . Anemia 11/27/2012    Social History   Tobacco Use  . Smoking status: Former Smoker    Types: Cigarettes    Quit date: 10/22/1996    Years since quitting:  23.0  . Smokeless tobacco: Never Used  Substance Use Topics  . Alcohol use: Yes    Alcohol/week: 0.0 standard drinks    Comment: daily- wine    Current Outpatient Medications:  .  albuterol (PROVENTIL HFA;VENTOLIN HFA) 108 (90 Base) MCG/ACT inhaler, Inhale 2 puffs into the lungs every 6 (six) hours as needed for wheezing or shortness of breath., Disp: 1 Inhaler, Rfl: 2 .  ALPRAZolam (XANAX) 0.5 MG tablet, TAKE 1 TABLET BY MOUTH TWICE DAILY AS NEEDED FOR ANXIETY, Disp: 60 tablet, Rfl: 1 .  Biotin 10 MG TABS, Take by mouth., Disp: , Rfl:  .  calcium-vitamin D (OSCAL WITH D) 250-125 MG-UNIT tablet, Take 1 tablet by mouth daily., Disp: , Rfl:  .  citalopram (CELEXA) 40 MG tablet, TAKE 1 TABLET DAILY, Disp: 90 tablet, Rfl: 1 .  levothyroxine (SYNTHROID, LEVOTHROID) 175 MCG tablet, Take 175 mcg by mouth daily before breakfast., Disp: , Rfl:  .  meloxicam (MOBIC) 15 MG tablet, Take 1 tablet (15 mg total) by mouth daily., Disp: 30 tablet, Rfl: 2 .  Multiple Vitamin (MULTIVITAMIN) tablet, Take 1 tablet by mouth daily., Disp: , Rfl:  .  naproxen sodium (ALEVE) 220 MG tablet, Take 220 mg by mouth 2 (two) times daily as needed., Disp: , Rfl:   Allergies  Allergen Reactions  . Benzoin Dermatitis    Blisters, itching, redness, hot sensation with combo of Benzoin and steri strips  . Iodine Solution [Povidone Iodine]     rash  . Oxycodone Itching  .  Mouthwashes Other (See Comments)    D[employer]'s mouthwash irritated mouth.    Objective:   Temp 97.9 F (36.6 C) (Tympanic)   Ht 5\' 6"  (1.676 m)   Wt 174 lb (78.9 kg)   LMP 01/18/2014   BMI 28.08 kg/m  AAOx3, NAD NCAT, EOMI No obvious CN deficits Coloring WNL Pt is able to speak clearly, coherently without shortness of breath or increased work of breathing.  + dry, hacking cough Thought process is linear.  Mood is appropriate.   Assessment and Plan:   Cough Variant Asthma- new.  pt reports cough ongoing x1 yr.  Otherwise feeling fine.  Will  cough w/ exertion or prolonged talking.  Suspect cough variant asthma.  Start daily ICS.  Continue to use albuterol PRN.  If no improvement, will need referral to pulmonary for complete assessment.  Pt expressed understanding and is in agreement w/ plan.    Annye Asa, MD 11/04/2019

## 2019-11-17 ENCOUNTER — Encounter: Payer: Self-pay | Admitting: General Practice

## 2019-12-22 ENCOUNTER — Other Ambulatory Visit: Payer: Self-pay | Admitting: Family Medicine

## 2019-12-23 NOTE — Telephone Encounter (Signed)
Last OV 11/04/19 Alprazolam last filled 09/01/19 #60 with 1

## 2019-12-29 ENCOUNTER — Encounter: Payer: Self-pay | Admitting: Family Medicine

## 2019-12-30 NOTE — Telephone Encounter (Signed)
Last OV 11/04/19 Alprazolam last filled 12/24/19 #60 with 1 

## 2020-01-31 ENCOUNTER — Other Ambulatory Visit: Payer: Self-pay | Admitting: Family Medicine

## 2020-02-10 ENCOUNTER — Other Ambulatory Visit: Payer: Self-pay | Admitting: General Practice

## 2020-02-10 MED ORDER — QVAR REDIHALER 80 MCG/ACT IN AERB: 2.0000 | INHALATION_SPRAY | Freq: Two times a day (BID) | RESPIRATORY_TRACT | 3 refills | Status: DC

## 2020-02-10 MED ORDER — ALBUTEROL SULFATE HFA 108 (90 BASE) MCG/ACT IN AERS: 2.0000 | INHALATION_SPRAY | Freq: Four times a day (QID) | RESPIRATORY_TRACT | 3 refills | Status: DC | PRN

## 2020-03-04 ENCOUNTER — Other Ambulatory Visit: Payer: Self-pay | Admitting: Emergency Medicine

## 2020-03-04 MED ORDER — QVAR REDIHALER 80 MCG/ACT IN AERB: 2.0000 | INHALATION_SPRAY | Freq: Two times a day (BID) | RESPIRATORY_TRACT | 6 refills | Status: DC

## 2020-03-11 ENCOUNTER — Ambulatory Visit: Payer: Commercial Managed Care - PPO | Admitting: Orthopaedic Surgery

## 2020-03-11 ENCOUNTER — Other Ambulatory Visit: Payer: Self-pay

## 2020-03-11 ENCOUNTER — Encounter: Payer: Self-pay | Admitting: Orthopaedic Surgery

## 2020-03-11 ENCOUNTER — Encounter: Payer: Self-pay | Admitting: Family Medicine

## 2020-03-11 ENCOUNTER — Ambulatory Visit: Payer: Self-pay

## 2020-03-11 VITALS — BP 121/77 | HR 62 | Ht 66.5 in | Wt 169.0 lb

## 2020-03-11 DIAGNOSIS — M25551 Pain in right hip: Secondary | ICD-10-CM | POA: Diagnosis not present

## 2020-03-11 DIAGNOSIS — M25552 Pain in left hip: Secondary | ICD-10-CM | POA: Diagnosis not present

## 2020-03-11 DIAGNOSIS — M51369 Other intervertebral disc degeneration, lumbar region without mention of lumbar back pain or lower extremity pain: Secondary | ICD-10-CM | POA: Insufficient documentation

## 2020-03-11 DIAGNOSIS — M5136 Other intervertebral disc degeneration, lumbar region: Secondary | ICD-10-CM

## 2020-03-11 DIAGNOSIS — J45991 Cough variant asthma: Secondary | ICD-10-CM

## 2020-03-11 MED ORDER — PREDNISONE 10 MG (21) PO TBPK: ORAL_TABLET | ORAL | 0 refills | Status: DC

## 2020-03-11 NOTE — Progress Notes (Signed)
Office Visit Note   Patient: Rebecca Gallegos           Date of Birth: 17-Sep-1966           MRN: [id number] Visit Date: 03/11/2020              Requested by: Midge Minium, MD [address] [place],  [place] [address] PCP: Midge Minium, MD   Assessment & Plan: Visit Diagnoses:  1. Bilateral hip pain   2. Other intervertebral disc degeneration, lumbar region     Plan: We will send in some prednisone she can take 4, 3, 2, 1 and then save the remaining tablets. We reviewed the x-rays she has had some progressive disc space narrowing and likely has a flare at that level. No radiculopathy on exam today. She will call and return if she has persistent symptoms.  Follow-Up Instructions: Return if symptoms worsen or fail to improve.   Orders:  Orders Placed This Encounter  Procedures  . XR HIP UNILAT W OR W/O PELVIS 2-3 VIEWS RIGHT  . XR Lumbar Spine 2-3 Views   Meds ordered this encounter  Medications  . predniSONE (STERAPRED UNI-PAK 21 TAB) 10 MG (21) TBPK tablet    Sig: Take as instructed with food.    Dispense:  21 tablet    Refill:  0      Procedures: No procedures performed   Clinical Data: No additional findings.   Subjective: Chief Complaint  Patient presents with  . Right Hip - Pain  . Left Hip - Pain    HPI 54 year old female 15 years post right L4-5 microdiscectomy by me is here with 2-week history of significant back and right buttocks and hip pain. They went to [facility] she walked 20,000 steps per day with gradual increasing back pain hip pain. She does not recall any problems loading and unloading suitcases or carrying a backpack. She has been taking 4 Aleve a day without relief. Difficulty sleeping. She not noticed any weakness. No bowel or bladder associated symptoms.  Review of Systems possible previous cervical fusion Cloward technique and L4-5 right microdiscectomy 15 years ago. Otherwise noncontributory to HPI. Patient does take an  inhaler for asthma.   Objective: Vital Signs: BP 121/77   Pulse 62   Ht 5' 6.5" (1.689 m)   Wt 169 lb (76.7 kg)   LMP 01/18/2014   BMI 26.87 kg/m   Physical Exam Constitutional:      Appearance: She is well-developed.  HENT:     Head: Normocephalic.     Right Ear: External ear normal.     Left Ear: External ear normal.  Eyes:     Pupils: Pupils are equal, round, and reactive to light.  Neck:     Thyroid: No thyromegaly.     Trachea: No tracheal deviation.  Cardiovascular:     Rate and Rhythm: Normal rate.  Pulmonary:     Effort: Pulmonary effort is normal.  Abdominal:     Palpations: Abdomen is soft.  Skin:    General: Skin is warm and dry.  Neurological:     Mental Status: She is alert and oriented to person, place, and time.  Psychiatric:        Behavior: Behavior normal.     Ortho Exam well-healed lumbar incision L4-5 few millimeters to the right of midline. No significant sciatic notch tenderness negative straight leg raising 90 degrees knee and ankle jerk are intact anterior tib gastrocsoleus is  strong she can heel and toe walk. Well-healed left leg donor site for Cloward procedure nontender. No hip flexion weakness quads are strong.  Specialty Comments:  No specialty comments available.  Imaging: XR HIP UNILAT W OR W/O PELVIS 2-3 VIEWS RIGHT  Result Date: 03/11/2020 AP pelvis frog-leg right hip obtained and reviewed. This shows normal hip joint. Sacroiliac joints are normal. There is some endplate narrowing noted at L4-5 better visualized on lumbar images. Impression: Right hip x-rays negative for acute or degenerative changes.  XR Lumbar Spine 2-3 Views  Result Date: 03/11/2020 AP lateral lumbar images are obtained and reviewed. This shows disc base narrowing at L4-5 with endplate sclerosis facet arthropathy worse on the right than the left. Postoperative laminotomy changes noted on the right. Impression: L4-5 disc degeneration with disc space narrowing and  facet arthropathy. No significant changes at other levels.    PMFS History: Patient Active Problem List   Diagnosis Date Noted  . Other intervertebral disc degeneration, lumbar region 03/11/2020  . Bleeding hemorrhoid 10/21/2018  . Palpitations 07/23/2018  . Chronic pain of right knee 05/07/2018  . Bilateral hip pain 05/07/2018  . Physical exam 07/19/2017  . Pre-diabetes 03/09/2016  . S/P laparoscopic assisted vaginal hysterectomy (LAVH) 05/06/2015  . S/P BSO (bilateral salpingo-oophorectomy) 05/06/2015  . S/P endometrial ablation 04/06/2015  . Family history of ovarian cancer 04/06/2015  . Family history of malignant neoplasm of gastrointestinal tract 03/05/2014  . Family history of malignant neoplasm of breast 03/05/2014  . Hypothyroidism 11/27/2012  . Anxiety 11/27/2012  . Anemia 11/27/2012   Past Medical History:  Diagnosis Date  . Anemia   . Anxiety    hx- has gone away since thyroid level controlled  . Family history of ovarian cancer 04/06/2015  . Female pelvic pain 04/06/2015  . Hematometra 05/05/2015  . Hypothyroidism   . S/P BSO (bilateral salpingo-oophorectomy) 05/06/2015  . S/P endometrial ablation 04/06/2015  . S/P laparoscopic assisted vaginal hysterectomy (LAVH) 05/06/2015    Family History  Problem Relation Age of Onset  . Ovarian cancer Mother   . Mental illness Mother   . Diabetes Mother   . Breast cancer Mother 42  . Heart disease Maternal Grandfather   . Cancer Maternal Grandfather 41       colon  . Cancer Maternal Aunt 64       blood cancer; unknown type  . Breast cancer Maternal Aunt   . Cancer Maternal Grandmother 65       breast  . Breast cancer Maternal Grandmother 85  . Cancer Maternal Aunt   . Breast cancer Sister   . Esophageal cancer Neg Hx   . Rectal cancer Neg Hx   . Stomach cancer Neg Hx     Past Surgical History:  Procedure Laterality Date  . BACK SURGERY  2009  . CERVICAL SPINE SURGERY    . LAPAROSCOPIC ASSISTED VAGINAL  HYSTERECTOMY N/A 05/06/2015   Procedure: LAPAROSCOPIC ASSISTED VAGINAL HYSTERECTOMY;  Surgeon: Janyth Contes, MD;  Location: [facility];  Service: Gynecology;  Laterality: N/A;  . SALPINGOOPHORECTOMY Bilateral 05/06/2015   Procedure: SALPINGO OOPHORECTOMY;  Surgeon: Janyth Contes, MD;  Location: [facility];  Service: Gynecology;  Laterality: Bilateral;  . THYROIDECTOMY    . TUBAL LIGATION  1991   bilateral   Social History   Occupational History  . Not on file  Tobacco Use  . Smoking status: Former Smoker    Types: Cigarettes    Quit date: 10/22/1996    Years since quitting: 23.4  .  Smokeless tobacco: Never Used  Substance and Sexual Activity  . Alcohol use: Yes    Alcohol/week: 0.0 standard drinks    Comment: daily- wine  . Drug use: No  . Sexual activity: Not on file

## 2020-04-04 ENCOUNTER — Other Ambulatory Visit: Payer: Self-pay | Admitting: Family Medicine

## 2020-04-05 NOTE — Telephone Encounter (Signed)
Last OV 11/04/19 Alprazolam last filled 12/24/19 #60 with 1

## 2020-04-14 ENCOUNTER — Ambulatory Visit: Payer: Commercial Managed Care - PPO | Admitting: Emergency Medicine

## 2020-04-14 ENCOUNTER — Encounter: Payer: Self-pay | Admitting: Emergency Medicine

## 2020-04-14 ENCOUNTER — Ambulatory Visit (INDEPENDENT_AMBULATORY_CARE_PROVIDER_SITE_OTHER): Payer: Commercial Managed Care - PPO

## 2020-04-14 ENCOUNTER — Other Ambulatory Visit: Payer: Self-pay

## 2020-04-14 VITALS — BP 130/80 | HR 64 | Temp 97.9°F | Ht 66.0 in | Wt 173.8 lb

## 2020-04-14 DIAGNOSIS — R05 Cough: Secondary | ICD-10-CM | POA: Diagnosis not present

## 2020-04-14 DIAGNOSIS — R059 Cough, unspecified: Secondary | ICD-10-CM

## 2020-04-14 DIAGNOSIS — R053 Chronic cough: Secondary | ICD-10-CM

## 2020-04-14 MED ORDER — PANTOPRAZOLE SODIUM 40 MG PO TBEC: 40.0000 mg | DELAYED_RELEASE_TABLET | Freq: Every day | ORAL | 1 refills | Status: DC

## 2020-04-14 MED ORDER — HYDROCODONE-HOMATROPINE 5-1.5 MG/5ML PO SYRP: 5.0000 mL | ORAL_SOLUTION | Freq: Four times a day (QID) | ORAL | 0 refills | Status: DC | PRN

## 2020-04-14 NOTE — Progress Notes (Signed)
Subjective:    Patient ID: Rebecca Gallegos, female    DOB: 1966/03/28, 54 y.o.   MRN: [id number]  HPI 54 year old woman with a minimal tobacco history (7 pack years), hypothyroidism, anemia, allergic rhinitis. ? Hx exercise induced asthma, never treated.   She is referred today for evaluation of cough. She has been having cough for years. Has been worse over about 2-3 years. Non-productive. Can be precipitated by talking - sometimes loses her voice. She has had associated SOB due to the paroxysms. She may have some nasal congestion, has a globus sensation. Has been on loratadine and fluticasone for 1 year, no real benefit. She was given albuterol - unclear whether it helped, may have. She was then later started on Qvar - no change. No real GERD sx.   Has been on tessalon perles before - no help.     Review of Systems As per HPI  Past Medical History:  Diagnosis Date   Anemia    Anxiety    hx- has gone away since thyroid level controlled   Family history of ovarian cancer 04/06/2015   Female pelvic pain 04/06/2015   Hematometra 05/05/2015   Hypothyroidism    S/P BSO (bilateral salpingo-oophorectomy) 05/06/2015   S/P endometrial ablation 04/06/2015   S/P laparoscopic assisted vaginal hysterectomy (LAVH) 05/06/2015     Family History  Problem Relation Age of Onset   Ovarian cancer Mother    Mental illness Mother    Diabetes Mother    Breast cancer Mother 59   Heart disease Maternal Grandfather    Cancer Maternal Grandfather 80       colon   Cancer Maternal Aunt 35       blood cancer; unknown type   Breast cancer Maternal Aunt    Cancer Maternal Grandmother 85       breast   Breast cancer Maternal Grandmother 85   Cancer Maternal Aunt    Breast cancer Sister    Esophageal cancer Neg Hx    Rectal cancer Neg Hx    Stomach cancer Neg Hx      Social History   Socioeconomic History   Marital status: Married    Spouse name: Not on file    Number of children: Not on file   Years of education: Not on file   Highest education level: Not on file  Occupational History   Not on file  Tobacco Use   Smoking status: Former Smoker    Packs/day: 1.00    Years: 7.00    Pack years: 7.00    Types: Cigarettes    Quit date: 10/22/1996    Years since quitting: 23.4   Smokeless tobacco: Never Used  Vaping Use   Vaping Use: Never used  Substance and Sexual Activity   Alcohol use: Yes    Alcohol/week: 0.0 standard drinks    Comment: daily- wine   Drug use: No   Sexual activity: Not on file  Other Topics Concern   Not on file  Social History Narrative   Not on file   Social Determinants of Health   Financial Resource Strain:    Difficulty of Paying Living Expenses:   Food Insecurity:    Worried About Charity fundraiser in the Last Year:    Arboriculturist in the Last Year:   Transportation Needs:    Lack of Transportation (Medical):    Lack of Transportation (Non-Medical):   Physical Activity:    Days of Exercise per  Week:    Minutes of Exercise per Session:   Stress:    Feeling of Stress :   Social Connections:    Frequency of Communication with Friends and Family:    Frequency of Social Gatherings with Friends and Family:    Attends Religious Services:    Active Member of Clubs or Organizations:    Attends Archivist Meetings:    Marital Status:   Intimate Partner Violence:    Fear of Current or Ex-Partner:    Emotionally Abused:    Physically Abused:    Sexually Abused:     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    Allergies  Allergen Reactions   Benzoin Dermatitis    Blisters, itching, redness, hot sensation with combo of Benzoin and steri strips   Iodine Solution [Povidone Iodine]     rash   Oxycodone Itching   Mouthwashes Other (See Comments)    [facility] mouthwash irritated mouth.     Outpatient Medications Prior to  Visit  Medication Sig Dispense Refill   albuterol (VENTOLIN HFA) 108 (90 Base) MCG/ACT inhaler Inhale 2 puffs into the lungs every 6 (six) hours as needed for wheezing or shortness of breath. 18 g 3   ALPRAZolam (XANAX) 0.5 MG tablet TAKE 1 TABLET BY MOUTH TWICE A DAY AS NEEDED FOR ANXIETY 60 tablet 1   beclomethasone (QVAR REDIHALER) 80 MCG/ACT inhaler Inhale 2 puffs into the lungs 2 (two) times daily. 10.6 g 6   Biotin 10 MG TABS Take by mouth.     calcium-vitamin D (OSCAL WITH D) 250-125 MG-UNIT tablet Take 1 tablet by mouth daily.     citalopram (CELEXA) 20 MG tablet Take 20 mg by mouth daily.     levothyroxine (SYNTHROID) 150 MCG tablet Take 150 mcg by mouth daily.     loratadine (CLARITIN) 10 MG tablet Take 10 mg by mouth daily.     Multiple Vitamin (MULTIVITAMIN) tablet Take 1 tablet by mouth daily.     naproxen sodium (ALEVE) 220 MG tablet Take 220 mg by mouth 2 (two) times daily as needed.     citalopram (CELEXA) 40 MG tablet TAKE 1 TABLET DAILY 90 tablet 1   levothyroxine (SYNTHROID, LEVOTHROID) 175 MCG tablet Take 175 mcg by mouth daily before breakfast.     meloxicam (MOBIC) 15 MG tablet Take 1 tablet (15 mg total) by mouth daily. 30 tablet 2   predniSONE (STERAPRED UNI-PAK 21 TAB) 10 MG (21) TBPK tablet Take as instructed with food. 21 tablet 0   No facility-administered medications prior to visit.        Objective:   Physical Exam Vitals:   04/14/20 1545  BP: 130/80  Pulse: 64  Temp: 97.9 F (36.6 C)  TempSrc: Oral  SpO2: 99%  Weight: 173 lb 12.8 oz (78.8 kg)  Height: 5\' 6"  (1.676 m)   Gen: Pleasant, well-nourished, in no distress,  normal affect  ENT: No lesions,  mouth clear,  oropharynx clear, no postnasal drip  Neck: No JVD, no stridor  Lungs: No use of accessory muscles, no crackles or wheezing on normal respiration, no wheeze on forced expiration  Cardiovascular: RRR, heart sounds normal, no murmur or gallops, no peripheral  edema  Musculoskeletal: No deformities, no cyanosis or clubbing  Neuro: alert, awake, non focal  Skin: Warm, no lesions or rash      Assessment & Plan:  Chronic cough Sounds upper airway in nature.  She  is being treated for allergic rhinitis without much success.  She denies any GERD symptoms.  She is willing to take an empiric PPI to see if she gets benefit.  I think she needs chest x-ray now, pulmonary function testing to rule out airflow obstruction.  Also discussed voice rest, cough suppression.  If she continues to have difficulty than she will likely need a bronchoscopy and airway inspection.  Please continue your loratadine and fluticasone nasal spray as you have been taking them. We will start pantoprazole 40 mg once daily until next visit.  Take this medication 1 hour around food. Try your best to avoid throat clearing and suppress your cough.  It may be helpful to use a sugar-free candy.  When you have the urge to cough or clear your throat, just swallow. Use Hycodan cough syrup, 5 cc up to every 6 hours if needed for cough suppression. You would benefit from a dedicated period of voice rest if possible. We will perform a chest x-ray today We will perform pulmonary function testing in next office visit. Follow with Dr. Lamonte Sakai next available with full pulmonary function testing on the same day.  Baltazar Apo, MD, PhD 04/14/2020, 4:50 PM [facility] [phone] or if no answer [phone]

## 2020-04-14 NOTE — Patient Instructions (Signed)
Please continue your loratadine and fluticasone nasal spray as you have been taking them. We will start pantoprazole 40 mg once daily until next visit.  Take this medication 1 hour around food. Try your best to avoid throat clearing and suppress your cough.  It may be helpful to use a sugar-free candy.  When you have the urge to cough or clear your throat, just swallow. Use Hycodan cough syrup, 5 cc up to every 6 hours if needed for cough suppression. You would benefit from a dedicated period of voice rest if possible. We will perform a chest x-ray today We will perform pulmonary function testing in next office visit. Follow with Dr. Lamonte Sakai next available with full pulmonary function testing on the same day.

## 2020-04-14 NOTE — Assessment & Plan Note (Signed)
Sounds upper airway in nature.  She is being treated for allergic rhinitis without much success.  She denies any GERD symptoms.  She is willing to take an empiric PPI to see if she gets benefit.  I think she needs chest x-ray now, pulmonary function testing to rule out airflow obstruction.  Also discussed voice rest, cough suppression.  If she continues to have difficulty than she will likely need a bronchoscopy and airway inspection.  Please continue your loratadine and fluticasone nasal spray as you have been taking them. We will start pantoprazole 40 mg once daily until next visit.  Take this medication 1 hour around food. Try your best to avoid throat clearing and suppress your cough.  It may be helpful to use a sugar-free candy.  When you have the urge to cough or clear your throat, just swallow. Use Hycodan cough syrup, 5 cc up to every 6 hours if needed for cough suppression. You would benefit from a dedicated period of voice rest if possible. We will perform a chest x-ray today We will perform pulmonary function testing in next office visit. Follow with Dr. Lamonte Sakai next available with full pulmonary function testing on the same day.

## 2020-06-06 ENCOUNTER — Encounter: Payer: Self-pay | Admitting: Family Medicine

## 2020-06-17 ENCOUNTER — Other Ambulatory Visit (HOSPITAL_COMMUNITY)
Admission: RE | Admit: 2020-06-17 | Discharge: 2020-06-17 | Disposition: A | Discharge status: Home / Self Care | Payer: Commercial Managed Care - PPO | Source: Ambulatory Visit | Attending: Emergency Medicine | Admitting: Emergency Medicine

## 2020-06-17 DIAGNOSIS — Z20822 Contact with and (suspected) exposure to covid-19: Secondary | ICD-10-CM | POA: Diagnosis not present

## 2020-06-17 DIAGNOSIS — Z01812 Encounter for preprocedural laboratory examination: Secondary | ICD-10-CM | POA: Diagnosis present

## 2020-06-17 LAB — SARS CORONAVIRUS 2 (TAT 6-24 HRS): SARS Coronavirus 2: NEGATIVE

## 2020-06-20 ENCOUNTER — Other Ambulatory Visit: Payer: Self-pay

## 2020-06-20 ENCOUNTER — Ambulatory Visit: Payer: Commercial Managed Care - PPO | Admitting: Emergency Medicine

## 2020-06-20 ENCOUNTER — Ambulatory Visit (INDEPENDENT_AMBULATORY_CARE_PROVIDER_SITE_OTHER): Payer: Commercial Managed Care - PPO | Admitting: Emergency Medicine

## 2020-06-20 ENCOUNTER — Encounter: Payer: Self-pay | Admitting: Emergency Medicine

## 2020-06-20 DIAGNOSIS — R05 Cough: Secondary | ICD-10-CM

## 2020-06-20 DIAGNOSIS — R053 Chronic cough: Secondary | ICD-10-CM

## 2020-06-20 DIAGNOSIS — R059 Cough, unspecified: Secondary | ICD-10-CM

## 2020-06-20 LAB — PULMONARY FUNCTION TEST
DL/VA % pred: 108 %
DL/VA: 4.54 ml/min/mmHg/L
DLCO cor % pred: 115 %
DLCO cor: 25.49 ml/min/mmHg
DLCO unc % pred: 115 %
DLCO unc: 25.49 ml/min/mmHg
FEF 25-75 Post: 4.01 L/sec
FEF 25-75 Pre: 3.65 L/sec
FEF2575-%Change-Post: 9 %
FEF2575-%Pred-Post: 149 %
FEF2575-%Pred-Pre: 136 %
FEV1-%Change-Post: 3 %
FEV1-%Pred-Post: 120 %
FEV1-%Pred-Pre: 116 %
FEV1-Post: 3.46 L
FEV1-Pre: 3.34 L
FEV1FVC-%Change-Post: 2 %
FEV1FVC-%Pred-Pre: 103 %
FEV6-%Change-Post: 0 %
FEV6-%Pred-Post: 115 %
FEV6-%Pred-Pre: 114 %
FEV6-Post: 4.11 L
FEV6-Pre: 4.08 L
FEV6FVC-%Pred-Post: 103 %
FEV6FVC-%Pred-Pre: 103 %
FVC-%Change-Post: 0 %
FVC-%Pred-Post: 111 %
FVC-%Pred-Pre: 110 %
FVC-Post: 4.11 L
FVC-Pre: 4.08 L
Post FEV1/FVC ratio: 84 %
Post FEV6/FVC ratio: 100 %
Pre FEV1/FVC ratio: 82 %
Pre FEV6/FVC Ratio: 100 %
RV % pred: 74 %
RV: 1.47 L
TLC % pred: 100 %
TLC: 5.37 L

## 2020-06-20 NOTE — H&P (View-Only) (Signed)
   Subjective:    Patient ID: Rebecca Gallegos, female    DOB: [birth year], 54 y.o.   MRN: [id number]  HPI 54 year old woman with a minimal tobacco history (7 pack years), hypothyroidism, anemia, allergic rhinitis. ? Hx exercise induced asthma, never treated.   She is referred today for evaluation of cough. She has been having cough for years. Has been worse over about 2-3 years. Non-productive. Can be precipitated by talking - sometimes loses her voice. She has had associated SOB due to the paroxysms. She may have some nasal congestion, has a globus sensation. Has been on loratadine and fluticasone for 1 year, no real benefit. She was given albuterol - unclear whether it helped, may have. She was then later started on Qvar - no change. No real GERD sx.   Has been on tessalon perles before - no help.    ROV 06/20/20 -- this follow-up visit 54 year old woman with hypothyroidism, allergic rhinitis, questionable exercise-induced asthma.  She had been seen for chronic nonproductive cough.  At her initial visit I continue loratadine, fluticasone nasal spray and started pantoprazole.  We also tried to suppress her cough with Hycodan. She was unable to take the hycodan due to itching. She took pantoprazole for 3 weeks without any affect - she though it made her gain wt, about 5 lbs. Didn't notice that it changed the cough  She feels that her nasal gtt is fairly well controlled.  Chest x-ray done 04/16/2020 reviewed by me was normal  Pulmonary function testing done today reviewed by me, show normal airflows, possible restriction based only on a decreased residual volume and normal gas exchange/DLCO.    Review of Systems As per HPI     Objective:   Physical Exam Vitals:   06/20/20 1556  BP: 118/68  Pulse: 74  Temp: 98.4 F (36.9 C)  TempSrc: Oral  SpO2: 98%  Weight: 173 lb (78.5 kg)  Height: 5\' 6"  (1.676 m)   Gen: Pleasant, well-nourished, in no distress,  normal affect  ENT: No lesions,   mouth clear,  oropharynx clear, no postnasal drip  Neck: No JVD, no stridor  Lungs: No use of accessory muscles, no crackles or wheezing on normal respiration, no wheeze on forced expiration  Cardiovascular: RRR, heart sounds normal, no murmur or gallops, no peripheral edema  Musculoskeletal: No deformities, no cyanosis or clubbing  Neuro: alert, awake, non focal  Skin: Warm, no lesions or rash      Assessment & Plan:  Chronic cough She did not tolerate Hycodan for cough suppression.  She also did not tolerate pantoprazole well but she did take it for 3 weeks without much change in her cough at all.  She remains on fluticasone, loratadine with some residual nasal congestion.  She may benefit going forward from allergy referral.  Chest x-ray normal, PFT normal.  I think she needs bronchoscopy to visualize her airways, check culture data and for eosinophils.  We will work on arranging.  Please continue fluticasone nasal spray and loratadine as you have been taking them. Continue to practice voice rest if you are able Try to avoid throat clearing Try to suppress your cough with over-the-counter Delsym, cough drops as able We will not restart esophageal reflux medication at this time We will arrange for bronchoscopy to evaluate the airways Follow with Dr Rebecca Gallegos in 1 month  Baltazar Apo, MD, PhD 06/20/2020, 4:23 PM [name] [phone] or if no answer [phone]

## 2020-06-20 NOTE — Progress Notes (Signed)
° °  Subjective:    Patient ID: Rebecca Gallegos, female    DOB: 1965-12-06, 54 y.o.   MRN: [id number]  HPI 54 year old woman with a minimal tobacco history (7 pack years), hypothyroidism, anemia, allergic rhinitis. ? Hx exercise induced asthma, never treated.   She is referred today for evaluation of cough. She has been having cough for years. Has been worse over about 2-3 years. Non-productive. Can be precipitated by talking - sometimes loses her voice. She has had associated SOB due to the paroxysms. She may have some nasal congestion, has a globus sensation. Has been on loratadine and fluticasone for 1 year, no real benefit. She was given albuterol - unclear whether it helped, may have. She was then later started on Qvar - no change. No real GERD sx.   Has been on tessalon perles before - no help.    ROV 06/20/20 -- this follow-up visit 54 year old woman with hypothyroidism, allergic rhinitis, questionable exercise-induced asthma.  She had been seen for chronic nonproductive cough.  At her initial visit I continue loratadine, fluticasone nasal spray and started pantoprazole.  We also tried to suppress her cough with Hycodan. She was unable to take the hycodan due to itching. She took pantoprazole for 3 weeks without any affect - she though it made her gain wt, about 5 lbs. Didn't notice that it changed the cough  She feels that her nasal gtt is fairly well controlled.  Chest x-ray done 04/16/2020 reviewed by me was normal  Pulmonary function testing done today reviewed by me, show normal airflows, possible restriction based only on a decreased residual volume and normal gas exchange/DLCO.    Review of Systems As per HPI     Objective:   Physical Exam Vitals:   06/20/20 1556  BP: 118/68  Pulse: 74  Temp: 98.4 F (36.9 C)  TempSrc: Oral  SpO2: 98%  Weight: 173 lb (78.5 kg)  Height: 5\' 6"  (1.676 m)   Gen: Pleasant, well-nourished, in no distress,  normal affect  ENT: No lesions,   mouth clear,  oropharynx clear, no postnasal drip  Neck: No JVD, no stridor  Lungs: No use of accessory muscles, no crackles or wheezing on normal respiration, no wheeze on forced expiration  Cardiovascular: RRR, heart sounds normal, no murmur or gallops, no peripheral edema  Musculoskeletal: No deformities, no cyanosis or clubbing  Neuro: alert, awake, non focal  Skin: Warm, no lesions or rash      Assessment & Plan:  Chronic cough She did not tolerate Hycodan for cough suppression.  She also did not tolerate pantoprazole well but she did take it for 3 weeks without much change in her cough at all.  She remains on fluticasone, loratadine with some residual nasal congestion.  She may benefit going forward from allergy referral.  Chest x-ray normal, PFT normal.  I think she needs bronchoscopy to visualize her airways, check culture data and for eosinophils.  We will work on arranging.  Please continue fluticasone nasal spray and loratadine as you have been taking them. Continue to practice voice rest if you are able Try to avoid throat clearing Try to suppress your cough with over-the-counter Delsym, cough drops as able We will not restart esophageal reflux medication at this time We will arrange for bronchoscopy to evaluate the airways Follow with Dr Lamonte Sakai in 1 month  Baltazar Apo, MD, PhD 06/20/2020, 4:23 PM [facility] [phone] or if no answer [phone]

## 2020-06-20 NOTE — Assessment & Plan Note (Signed)
She did not tolerate Hycodan for cough suppression.  She also did not tolerate pantoprazole well but she did take it for 3 weeks without much change in her cough at all.  She remains on fluticasone, loratadine with some residual nasal congestion.  She may benefit going forward from allergy referral.  Chest x-ray normal, PFT normal.  I think she needs bronchoscopy to visualize her airways, check culture data and for eosinophils.  We will work on arranging.  Please continue fluticasone nasal spray and loratadine as you have been taking them. Continue to practice voice rest if you are able Try to avoid throat clearing Try to suppress your cough with over-the-counter Delsym, cough drops as able We will not restart esophageal reflux medication at this time We will arrange for bronchoscopy to evaluate the airways Follow with Dr Lamonte Sakai in 1 month

## 2020-06-20 NOTE — Progress Notes (Signed)
Full PFT performed today. °

## 2020-06-20 NOTE — Patient Instructions (Signed)
Please continue fluticasone nasal spray and loratadine as you have been taking them. Continue to practice voice rest if you are able Try to avoid throat clearing Try to suppress your cough with over-the-counter Delsym, cough drops as able We will not restart esophageal reflux medication at this time We will arrange for bronchoscopy to evaluate the airways Follow with Dr Lamonte Sakai in 1 month

## 2020-06-21 ENCOUNTER — Telehealth: Payer: Self-pay | Admitting: *Deleted

## 2020-06-21 DIAGNOSIS — R059 Cough, unspecified: Secondary | ICD-10-CM

## 2020-06-21 NOTE — Telephone Encounter (Signed)
-----   Message from Joellen Jersey sent at 06/20/2020  4:49 PM EDT ----- Regarding: bronch Order in pcc box for a bronch

## 2020-06-21 NOTE — Telephone Encounter (Signed)
Please schedule the following:  Standard bronchoscopy   Diagnosis: cough  Procedure: broinchoscopy  Anesthesia: MAC  Do you need Fluro? no  Priority: normal  Date: 8/10  Alternate Date: 8/13  Time: AM  Location: [facility]  Does patient have OSA? no DM? no Or Latex allergy? no  Medication Restriction: none  Anticoagulate/Antiplatelet: none  Pre-op Labs Ordered: CBC, CMP, PT/INR, PTT  Imaging request: none  (If, SuperDimension CT Chest, please have STAT courier sent to [facility] [address])   Please coordinate Pre-op COVID Testing   Dr. Lamonte Sakai do you mean 9/10 or 9/13? Please verify and I will work on scheduling

## 2020-06-21 NOTE — Telephone Encounter (Signed)
Yep that's what I should have put

## 2020-06-23 NOTE — Telephone Encounter (Signed)
Called and spoke with patient. Let them know their Bronch is scheduled for 07/04/20 at [facility] with Dr. Lamonte Sakai at 9:15.  Patient was instructed to arrive at hospital at [place]. They were instructed to bring someone with them as they will not be able to drive home from procedure. Patient instructed not to have anything to eat or drink after midnight.   Patient's covid screening is scheduled on 07/01/20 for Bronch procedure at 2:45pm.  Patient voiced understanding, nothing further needed  Routing to [place] as FYI  Labs placed

## 2020-06-23 NOTE — Telephone Encounter (Signed)
Thank you :)

## 2020-06-27 ENCOUNTER — Other Ambulatory Visit: Payer: Self-pay | Admitting: Family Medicine

## 2020-06-28 ENCOUNTER — Encounter (HOSPITAL_COMMUNITY): Payer: Self-pay | Admitting: Emergency Medicine

## 2020-06-28 ENCOUNTER — Other Ambulatory Visit: Payer: Self-pay

## 2020-06-28 NOTE — Telephone Encounter (Signed)
Last OV 11/22/19 Alprazolam last filled 04/05/20 #60 with 1

## 2020-07-01 ENCOUNTER — Other Ambulatory Visit (HOSPITAL_COMMUNITY)
Admission: RE | Admit: 2020-07-01 | Discharge: 2020-07-01 | Disposition: A | Discharge status: Home / Self Care | Payer: Commercial Managed Care - PPO | Source: Ambulatory Visit | Attending: Emergency Medicine | Admitting: Emergency Medicine

## 2020-07-01 DIAGNOSIS — Z20822 Contact with and (suspected) exposure to covid-19: Secondary | ICD-10-CM | POA: Insufficient documentation

## 2020-07-01 DIAGNOSIS — Z01812 Encounter for preprocedural laboratory examination: Secondary | ICD-10-CM | POA: Insufficient documentation

## 2020-07-01 LAB — SARS CORONAVIRUS 2 (TAT 6-24 HRS): SARS Coronavirus 2: NEGATIVE

## 2020-07-01 NOTE — Progress Notes (Signed)
Pre op call done for patient for endo procedure on Monday 9/13. Patient states they have been quarantined since covid test, will be NPO past midnight, and confirmed has a driver taking them home post procedure. All questions addressed.

## 2020-07-04 ENCOUNTER — Other Ambulatory Visit: Payer: Self-pay

## 2020-07-04 ENCOUNTER — Encounter (HOSPITAL_COMMUNITY)
Admission: RE | Disposition: A | Discharge status: Home / Self Care | Payer: Self-pay | Source: Home / Self Care | Attending: Emergency Medicine

## 2020-07-04 ENCOUNTER — Encounter (HOSPITAL_COMMUNITY): Payer: Self-pay | Admitting: Emergency Medicine

## 2020-07-04 ENCOUNTER — Ambulatory Visit (HOSPITAL_COMMUNITY)
Admission: RE | Admit: 2020-07-04 | Discharge: 2020-07-04 | Disposition: A | Discharge status: Home / Self Care | Payer: Commercial Managed Care - PPO | Attending: Emergency Medicine | Admitting: Emergency Medicine

## 2020-07-04 ENCOUNTER — Ambulatory Visit (HOSPITAL_COMMUNITY): Payer: Commercial Managed Care - PPO | Admitting: Anesthesiology

## 2020-07-04 DIAGNOSIS — J309 Allergic rhinitis, unspecified: Secondary | ICD-10-CM | POA: Diagnosis not present

## 2020-07-04 DIAGNOSIS — Z87891 Personal history of nicotine dependence: Secondary | ICD-10-CM | POA: Insufficient documentation

## 2020-07-04 DIAGNOSIS — R05 Cough: Secondary | ICD-10-CM | POA: Diagnosis present

## 2020-07-04 DIAGNOSIS — R053 Chronic cough: Secondary | ICD-10-CM

## 2020-07-04 DIAGNOSIS — Z79899 Other long term (current) drug therapy: Secondary | ICD-10-CM | POA: Diagnosis not present

## 2020-07-04 HISTORY — PX: BRONCHIAL WASHINGS: SHX5105

## 2020-07-04 HISTORY — DX: Presence of spectacles and contact lenses: Z97.3

## 2020-07-04 HISTORY — PX: VIDEO BRONCHOSCOPY: SHX5072

## 2020-07-04 LAB — BODY FLUID CELL COUNT WITH DIFFERENTIAL
Eos, Fluid: 1 %
Lymphs, Fluid: 35 %
Monocyte-Macrophage-Serous Fluid: 11 % — ABNORMAL LOW (ref 50–90)
Neutrophil Count, Fluid: 53 % — ABNORMAL HIGH (ref 0–25)
Total Nucleated Cell Count, Fluid: 915 cu mm (ref 0–1000)

## 2020-07-04 LAB — PATHOLOGIST SMEAR REVIEW

## 2020-07-04 SURGERY — VIDEO BRONCHOSCOPY WITHOUT FLUORO
Anesthesia: General

## 2020-07-04 MED ORDER — PROPOFOL 10 MG/ML IV BOLUS
INTRAVENOUS | Status: AC
  Filled 2020-07-04: qty 40

## 2020-07-04 MED ORDER — ONDANSETRON HCL 4 MG/2ML IJ SOLN
INTRAMUSCULAR | Status: DC | PRN
  Administered 2020-07-04: 4 mg via INTRAVENOUS

## 2020-07-04 MED ORDER — FENTANYL CITRATE (PF) 100 MCG/2ML IJ SOLN
INTRAMUSCULAR | Status: AC
  Filled 2020-07-04: qty 2

## 2020-07-04 MED ORDER — PROPOFOL 10 MG/ML IV BOLUS
INTRAVENOUS | Status: DC | PRN
  Administered 2020-07-04: 200 mg via INTRAVENOUS
  Administered 2020-07-04: 30 mg via INTRAVENOUS

## 2020-07-04 MED ORDER — PROPOFOL 500 MG/50ML IV EMUL
INTRAVENOUS | Status: DC | PRN
  Administered 2020-07-04: 150 ug/kg/min via INTRAVENOUS

## 2020-07-04 MED ORDER — PROPOFOL 10 MG/ML IV BOLUS
INTRAVENOUS | Status: AC
  Filled 2020-07-04: qty 20

## 2020-07-04 MED ORDER — LACTATED RINGERS IV SOLN
INTRAVENOUS | Status: DC
  Administered 2020-07-04: 1000 mL via INTRAVENOUS

## 2020-07-04 MED ORDER — EPHEDRINE SULFATE-NACL 50-0.9 MG/10ML-% IV SOSY
PREFILLED_SYRINGE | INTRAVENOUS | Status: DC | PRN
  Administered 2020-07-04: 10 mg via INTRAVENOUS

## 2020-07-04 MED ORDER — DEXAMETHASONE SODIUM PHOSPHATE 10 MG/ML IJ SOLN
INTRAMUSCULAR | Status: DC | PRN
  Administered 2020-07-04: 5 mg via INTRAVENOUS

## 2020-07-04 MED ORDER — LIDOCAINE 1 % OPTIME INJ - NO CHARGE
INTRAMUSCULAR | Status: DC | PRN
  Administered 2020-07-04: 10 mL

## 2020-07-04 MED ORDER — LIDOCAINE HCL (CARDIAC) PF 100 MG/5ML IV SOSY
PREFILLED_SYRINGE | INTRAVENOUS | Status: DC | PRN
  Administered 2020-07-04: 80 mg via INTRAVENOUS

## 2020-07-04 MED ORDER — FENTANYL CITRATE (PF) 100 MCG/2ML IJ SOLN
INTRAMUSCULAR | Status: DC | PRN
  Administered 2020-07-04: 50 ug via INTRAVENOUS

## 2020-07-04 MED ORDER — MIDAZOLAM HCL 2 MG/2ML IJ SOLN
INTRAMUSCULAR | Status: DC | PRN
  Administered 2020-07-04: 2 mg via INTRAVENOUS

## 2020-07-04 MED ORDER — MIDAZOLAM HCL 2 MG/2ML IJ SOLN
INTRAMUSCULAR | Status: AC
  Filled 2020-07-04: qty 2

## 2020-07-04 NOTE — Interval H&P Note (Signed)
History and Physical Interval Note:  07/04/2020 9:10 AM  Rebecca Gallegos  has presented today for surgery, with the diagnosis of COUGH.  The various methods of treatment have been discussed with the patient and family. After consideration of risks, benefits and other options for treatment, the patient has consented to  Procedure(s): VIDEO BRONCHOSCOPY WITHOUT FLUORO (N/A) as a surgical intervention.  The patient's history has been reviewed, patient examined, no change in status, stable for surgery.  I have reviewed the patient's chart and labs.  Questions were answered to the patient's satisfaction.     Collene Gobble

## 2020-07-04 NOTE — Anesthesia Postprocedure Evaluation (Signed)
Anesthesia Post Note  Patient: Rebecca Gallegos  Procedure(s) Performed: VIDEO BRONCHOSCOPY WITHOUT FLUORO (N/A )     Patient location during evaluation: Endoscopy Anesthesia Type: General Level of consciousness: awake and alert Pain management: pain level controlled Vital Signs Assessment: post-procedure vital signs reviewed and stable Respiratory status: spontaneous breathing, nonlabored ventilation and respiratory function stable Cardiovascular status: blood pressure returned to baseline and stable Postop Assessment: no apparent nausea or vomiting Anesthetic complications: no   No complications documented.  Last Vitals:  Vitals:   07/04/20 1015 07/04/20 1020  BP:  111/68  Pulse: (!) 59 (!) 59  Resp: 11 15  Temp:    SpO2: 97% 96%    Last Pain:  Vitals:   07/04/20 1015  TempSrc:   PainSc: 0-No pain                 Merlinda Frederick

## 2020-07-04 NOTE — Transfer of Care (Signed)
Immediate Anesthesia Transfer of Care Note  Patient: Rebecca Gallegos  Procedure(s) Performed: VIDEO BRONCHOSCOPY WITHOUT FLUORO (N/A )  Patient Location: Endoscopy Unit  Anesthesia Type:General  Level of Consciousness: awake, alert , oriented and patient cooperative  Airway & Oxygen Therapy: Patient Spontanous Breathing  Post-op Assessment: Report given to RN and Post -op Vital signs reviewed and stable  Post vital signs: Reviewed and stable  Last Vitals:  Vitals Value Taken Time  BP 101/77 0950  Temp    Pulse 70   Resp    SpO2 96     Last Pain:  Vitals:   07/04/20 0844  TempSrc: Oral  PainSc: 0-No pain         Complications: No complications documented.

## 2020-07-04 NOTE — Discharge Instructions (Signed)
Flexible Bronchoscopy, Care After This sheet gives you information about how to care for yourself after your test. Your doctor may also give you more specific instructions. If you have problems or questions, contact your doctor. Follow these instructions at home: Eating and drinking  Do not eat or drink anything (not even water) for 2 hours after your test, or until your numbing medicine (local anesthetic) wears off.  When your numbness is gone and your cough and gag reflexes have come back, you may: ? Eat only soft foods. ? Slowly drink liquids.  The day after the test, go back to your normal diet. Driving  Do not drive for 24 hours if you were given a medicine to help you relax (sedative).  Do not drive or use heavy machinery while taking prescription pain medicine. General instructions   Take over-the-counter and prescription medicines only as told by your doctor.  Return to your normal activities as told. Ask what activities are safe for you.  Do not use any products that have nicotine or tobacco in them. This includes cigarettes and e-cigarettes. If you need help quitting, ask your doctor.  Keep all follow-up visits as told by your doctor. This is important. It is very important if you had a tissue sample (biopsy) taken. Get help right away if:  You have shortness of breath that gets worse.  You get light-headed.  You feel like you are going to pass out (faint).  You have chest pain.  You cough up: ? More than a little blood. ? More blood than before. Summary  Do not eat or drink anything (not even water) for 2 hours after your test, or until your numbing medicine wears off.  Do not use cigarettes. Do not use e-cigarettes.  Get help right away if you have chest pain.  Please call our office for any questions or concerns. [phone].    This information is not intended to replace advice given to you by your health care provider. Make sure you discuss any  questions you have with your health care provider. Document Revised: 09/20/2017 Document Reviewed: 10/26/2016 Elsevier Patient Education  2020 [employer].

## 2020-07-04 NOTE — Anesthesia Procedure Notes (Signed)
Procedure Name: LMA Insertion Date/Time: 07/04/2020 9:22 AM Performed by: Raenette Rover, CRNA Pre-anesthesia Checklist: Patient identified, Emergency Drugs available, Suction available and Patient being monitored Patient Re-evaluated:Patient Re-evaluated prior to induction Oxygen Delivery Method: Circle system utilized Preoxygenation: Pre-oxygenation with 100% oxygen Ventilation: Mask ventilation without difficulty LMA: LMA with gastric port inserted LMA Size: 4.0 Number of attempts: 1 Placement Confirmation: positive ETCO2 and breath sounds checked- equal and bilateral Tube secured with: Tape Dental Injury: Teeth and Oropharynx as per pre-operative assessment

## 2020-07-04 NOTE — Op Note (Signed)
[facility] Cardiopulmonary Patient Name: Rebecca Gallegos Procedure Date: 07/04/2020 MRN: [id number] Attending MD: Collene Gobble , MD Date of Birth: 04-Jul-1966 CSN: [id number] Age: 54 Admit Type: Outpatient Ethnicity: Hispanic or Latino Procedure:             Bronchoscopy Indications:           Chronic cough with normal chest X-ray Providers:             Collene Gobble, MD, Wynonia Sours, RN, Darlene H.                         Rosana Hoes, Technician, Hedy Camara Referring MD:           Medicines:             Monitored Anesthesia Care Complications:         No immediate complications Estimated Blood Loss:  Estimated blood loss: none. Procedure:      Pre-Anesthesia Assessment:      - A History and Physical has been performed. Patient meds and allergies       have been reviewed. The risks and benefits of the procedure and the       sedation options and risks were discussed with the patient. All       questions were answered and informed consent was obtained. Patient       identification and proposed procedure were verified prior to the       procedure by the physician in the pre-procedure area. Mental Status       Examination: alert and oriented. Airway Examination: normal       oropharyngeal airway. Respiratory Examination: clear to auscultation. CV       Examination: RRR, no murmurs, no S3 or S4. ASA Grade Assessment: I - A       normal healthy patient. After reviewing the risks and benefits, the       patient was deemed in satisfactory condition to undergo the procedure.       The anesthesia plan was to use monitored anesthesia care (MAC).       Immediately prior to administration of medications, the patient was       re-assessed for adequacy to receive sedatives. The heart rate,       respiratory rate, oxygen saturations, blood pressure, adequacy of       pulmonary ventilation, and response to care were monitored throughout       the procedure. The physical status  of the patient was re-assessed after       the procedure.      After obtaining informed consent, the bronchoscope was passed under       direct vision. Throughout the procedure, the patient's blood pressure,       pulse, and oxygen saturations were monitored continuously. the [id number]       ([id number]) Olympus therapeutic bronchoscope was introduced through the       mouth, via laryngeal mask airway and advanced to the tracheobronchial       tree. The procedure was accomplished without difficulty. The patient       tolerated the procedure well. Findings:      The laryngeal mask airway is in good position. The vocal cords appear       slightly edematous but are otherwise normal. VC movement could not be       assessed. The subglottic space is normal. The trachea is of  normal       caliber. The carina is sharp. The tracheobronchial tree was examined to       at least the first subsegmental level. Bronchial mucosa and anatomy are       normal; there are no endobronchial lesions. There wer some scattered       thin yellow/white secretions that were easily suctioned and cleared.      Bronchoalveolar lavage was performed in the RUL apical segment (B1) of       the lung and sent for cell count, bacterial culture, fungal & AFB       analysis. 60 mL of fluid were instilled. 30 mL were returned. The return       was clear. There were no mucoid plugs in the return fluid. Impression:      - Chronic cough with normal chest X-ray      - The airway examination was normal.      - Bronchoalveolar lavage was performed in the RUL. Moderate Sedation:      N/A: per anesthesia care Recommendation:      - Await BAL and culture results. Procedure Code(s):      --- Professional ---      [phone], Bronchoscopy, rigid or flexible, including fluoroscopic guidance,       when performed; with bronchial alveolar lavage Diagnosis Code(s):      --- Professional ---      R05, Cough CPT copyright 2019 American Medical  Association. All rights reserved. The codes documented in this report are preliminary and upon coder review may  be revised to meet current compliance requirements. Collene Gobble, MD Collene Gobble, MD 07/04/2020 9:44:44 AM Number of Addenda: 0 Scope In: Scope Out:

## 2020-07-04 NOTE — Anesthesia Preprocedure Evaluation (Addendum)
Anesthesia Evaluation  Patient identified by MRN, date of birth, ID band  Reviewed: Allergy & Precautions, NPO status , Patient's Chart, lab work & pertinent test results  Airway Mallampati: I  TM Distance: >3 FB Neck ROM: Full    Dental no notable dental hx.    Pulmonary former smoker,   Chronic cough   Pulmonary exam normal breath sounds clear to auscultation       Cardiovascular Exercise Tolerance: Good negative cardio ROS Normal cardiovascular exam Rhythm:Regular Rate:Normal     Neuro/Psych negative neurological ROS  negative psych ROS   GI/Hepatic negative GI ROS, Neg liver ROS,   Endo/Other  Hypothyroidism   Renal/GU negative Renal ROS  negative genitourinary   Musculoskeletal  (+) Arthritis ,   Abdominal   Peds  Hematology   Anesthesia Other Findings   Reproductive/Obstetrics negative OB ROS                            Anesthesia Physical Anesthesia Plan  ASA: II  Anesthesia Plan: General   Post-op Pain Management:    Induction:   PONV Risk Score and Plan: 3 and Propofol infusion, Midazolam, Ondansetron and Treatment may vary due to age or medical condition  Airway Management Planned: LMA  Additional Equipment:   Intra-op Plan:   Post-operative Plan: Extubation in OR  Informed Consent: I have reviewed the patients History and Physical, chart, labs and discussed the procedure including the risks, benefits and alternatives for the proposed anesthesia with the patient or authorized representative who has indicated his/her understanding and acceptance.       Plan Discussed with: CRNA, Anesthesiologist and Surgeon  Anesthesia Plan Comments:        Anesthesia Quick Evaluation

## 2020-07-06 ENCOUNTER — Encounter (HOSPITAL_COMMUNITY): Payer: Self-pay | Admitting: Emergency Medicine

## 2020-07-06 LAB — CULTURE, RESPIRATORY W GRAM STAIN: Culture: NO GROWTH

## 2020-07-06 LAB — ACID FAST SMEAR (AFB, MYCOBACTERIA): Acid Fast Smear: NEGATIVE

## 2020-08-03 ENCOUNTER — Ambulatory Visit: Payer: Commercial Managed Care - PPO | Admitting: Emergency Medicine

## 2020-08-03 ENCOUNTER — Other Ambulatory Visit: Payer: Self-pay

## 2020-08-03 ENCOUNTER — Encounter: Payer: Self-pay | Admitting: Emergency Medicine

## 2020-08-03 VITALS — BP 124/68 | HR 65 | Temp 97.7°F | Ht 66.0 in | Wt 175.2 lb

## 2020-08-03 DIAGNOSIS — R053 Chronic cough: Secondary | ICD-10-CM | POA: Diagnosis not present

## 2020-08-03 DIAGNOSIS — Z23 Encounter for immunization: Secondary | ICD-10-CM

## 2020-08-03 NOTE — Progress Notes (Signed)
Subjective:    Patient ID: Rebecca Gallegos, female    DOB: 02-Oct-1966, 54 y.o.   MRN: [id number]  HPI 54 year old woman with a minimal tobacco history (7 pack years), hypothyroidism, anemia, allergic rhinitis. ? Hx exercise induced asthma, never treated.   She is referred today for evaluation of cough. She has been having cough for years. Has been worse over about 2-3 years. Non-productive. Can be precipitated by talking - sometimes loses her voice. She has had associated SOB due to the paroxysms. She may have some nasal congestion, has a globus sensation. Has been on loratadine and fluticasone for 1 year, no real benefit. She was given albuterol - unclear whether it helped, may have. She was then later started on Qvar - no change. No real GERD sx.   Has been on tessalon perles before - no help.    ROV 06/20/20 -- this follow-up visit 54 year old woman with hypothyroidism, allergic rhinitis, questionable exercise-induced asthma.  She had been seen for chronic nonproductive cough.  At her initial visit I continue loratadine, fluticasone nasal spray and started pantoprazole.  We also tried to suppress her cough with Hycodan. She was unable to take the hycodan due to itching. She took pantoprazole for 3 weeks without any affect - she though it made her gain wt, about 5 lbs. Didn't notice that it changed the cough  She feels that her nasal gtt is fairly well controlled.  Chest x-ray done 04/16/2020 reviewed by me was normal  Pulmonary function testing done today reviewed by me, show normal airflows, possible restriction based only on a decreased residual volume and normal gas exchange/DLCO.  ROV 08/03/20 --Rebecca Gallegos is 54, minimal tobacco history, follows up today for chronic nonproductive cough.  We treated allergic rhinitis with fluticasone and loratadine, used Delsym for cough suppression.  She underwent bronchoscopy 07/04/2020 that showed slightly edematous cords but otherwise normal, normal bronchial  anatomy.  There was some thin white secretions.  A BAL was performed, cell count with predominantly neutrophils (53%) 1% eosinophils.  Bacterial culture negative AFB smear and fungal culture negative.  PFT with normal airflows as above. Remains on flonase and loratadine.     Review of Systems As per HPI     Objective:   Physical Exam Vitals:   08/03/20 1137  BP: 124/68  Pulse: 65  Temp: 97.7 F (36.5 C)  TempSrc: Temporal  SpO2: 98%  Weight: 175 lb 3.2 oz (79.5 kg)  Height: _0  (1.676 m)   Gen: Pleasant, well-nourished, in no distress,  normal affect  ENT: No lesions,  mouth clear,  oropharynx clear, no postnasal drip  Neck: No JVD, no stridor  Lungs: No use of accessory muscles, no crackles or wheezing on normal respiration, no wheeze on forced expiration  Cardiovascular: RRR, heart sounds normal, no murmur or gallops, no peripheral edema  Musculoskeletal: No deformities, no cyanosis or clubbing  Neuro: alert, awake, non focal  Skin: Warm, no lesions or rash      Assessment & Plan:  Chronic cough Reassuring FOB, cx negative, no eosinophilia.   Continue your loratadine and nasal spray as you have been taking them. Your bronchoscopy shows that your cultures are negative, there are no inflammatory cells that were sustaining your cough Flu shot today I think the benefits of the COVID-19 vaccine outweigh the risks.  You should consider getting this if/when you are comfortable. Please call our office and follow-up if your cough or breathing change in any way.  Baltazar Apo,  MD, PhD 08/03/2020, 12:38 PM [facility] [phone] or if no answer [phone]

## 2020-08-03 NOTE — Patient Instructions (Signed)
Continue your loratadine and nasal spray as you have been taking them. Your bronchoscopy shows that your cultures are negative, there are no inflammatory cells that were sustaining your cough Flu shot today I think the benefits of the COVID-19 vaccine outweigh the risks.  You should consider getting this if/when you are comfortable. Please call our office and follow-up if your cough or breathing change in any way.

## 2020-08-03 NOTE — Assessment & Plan Note (Signed)
Reassuring FOB, cx negative, no eosinophilia.   Continue your loratadine and nasal spray as you have been taking them. Your bronchoscopy shows that your cultures are negative, there are no inflammatory cells that were sustaining your cough Flu shot today I think the benefits of the COVID-19 vaccine outweigh the risks.  You should consider getting this if/when you are comfortable. Please call our office and follow-up if your cough or breathing change in any way.

## 2020-08-05 LAB — FUNGUS CULTURE WITH STAIN

## 2020-08-05 LAB — FUNGAL ORGANISM REFLEX

## 2020-08-05 LAB — FUNGUS CULTURE RESULT

## 2020-08-17 LAB — ACID FAST CULTURE WITH REFLEXED SENSITIVITIES (MYCOBACTERIA): Acid Fast Culture: NEGATIVE

## 2020-08-20 ENCOUNTER — Other Ambulatory Visit: Payer: Self-pay | Admitting: Family Medicine

## 2020-08-30 ENCOUNTER — Other Ambulatory Visit: Payer: Self-pay | Admitting: Orthopaedic Surgery

## 2020-08-30 NOTE — Telephone Encounter (Signed)
Please advise 

## 2020-09-23 ENCOUNTER — Encounter: Payer: Self-pay | Admitting: Family Medicine

## 2020-09-24 ENCOUNTER — Other Ambulatory Visit: Payer: Self-pay | Admitting: Family Medicine

## 2020-09-29 ENCOUNTER — Other Ambulatory Visit: Payer: Self-pay | Admitting: Family Medicine

## 2020-09-29 DIAGNOSIS — Z0000 Encounter for general adult medical examination without abnormal findings: Secondary | ICD-10-CM

## 2020-11-10 ENCOUNTER — Other Ambulatory Visit: Payer: Self-pay

## 2020-11-10 ENCOUNTER — Ambulatory Visit
Admission: RE | Admit: 2020-11-10 | Discharge: 2020-11-10 | Disposition: A | Discharge status: Home / Self Care | Payer: Commercial Managed Care - PPO | Source: Ambulatory Visit | Attending: Family Medicine | Admitting: Family Medicine

## 2020-11-10 DIAGNOSIS — Z0000 Encounter for general adult medical examination without abnormal findings: Secondary | ICD-10-CM

## 2020-11-11 ENCOUNTER — Encounter: Payer: Commercial Managed Care - PPO | Admitting: Family Medicine

## 2020-12-05 ENCOUNTER — Ambulatory Visit (INDEPENDENT_AMBULATORY_CARE_PROVIDER_SITE_OTHER): Payer: Commercial Managed Care - PPO | Admitting: Family Medicine

## 2020-12-05 ENCOUNTER — Other Ambulatory Visit: Payer: Self-pay

## 2020-12-05 ENCOUNTER — Encounter: Payer: Self-pay | Admitting: Family Medicine

## 2020-12-05 VITALS — BP 130/80 | HR 61 | Temp 97.6°F | Resp 16 | Ht 67.0 in | Wt 174.8 lb

## 2020-12-05 DIAGNOSIS — R7301 Impaired fasting glucose: Secondary | ICD-10-CM | POA: Insufficient documentation

## 2020-12-05 DIAGNOSIS — Z0000 Encounter for general adult medical examination without abnormal findings: Secondary | ICD-10-CM

## 2020-12-05 DIAGNOSIS — E663 Overweight: Secondary | ICD-10-CM

## 2020-12-05 DIAGNOSIS — F419 Anxiety disorder, unspecified: Secondary | ICD-10-CM | POA: Diagnosis not present

## 2020-12-05 DIAGNOSIS — E890 Postprocedural hypothyroidism: Secondary | ICD-10-CM | POA: Insufficient documentation

## 2020-12-05 DIAGNOSIS — E7800 Pure hypercholesterolemia, unspecified: Secondary | ICD-10-CM | POA: Insufficient documentation

## 2020-12-05 DIAGNOSIS — F32A Depression, unspecified: Secondary | ICD-10-CM | POA: Diagnosis not present

## 2020-12-05 MED ORDER — BUPROPION HCL ER (XL) 150 MG PO TB24: 150.0000 mg | ORAL_TABLET | Freq: Every day | ORAL | 3 refills | Status: DC

## 2020-12-05 NOTE — Assessment & Plan Note (Signed)
Pt's PE WNL.  Reviewed recent labs from Dr Chalmers Cater.  WNL and no need to repeat.  UTD on mammo, colonoscopy, Tdap, flu.  Declines COVID.  Anticipatory guidance provided.

## 2020-12-05 NOTE — Assessment & Plan Note (Signed)
BMI is 27.38  Encouraged healthy diet and regular exercise.  Reviewed labs.  Will continue to follow.

## 2020-12-05 NOTE — Progress Notes (Signed)
   Subjective:    Patient ID: Rebecca Gallegos, female    DOB: 04-30-1966, 55 y.o.   MRN: [id number]  HPI CPE- UTD on colonoscopy, mammo, Tdap, flu.  Declines COVID.  Had recent labs at Endocrinology  Reviewed past medical, surgical, family and social histories.   Patient Care Team    Relationship Specialty Notifications Start End  Midge Minium, MD PCP - General Family Medicine  03/12/16   Jacelyn Pi, MD Consulting Physician Endocrinology  07/31/17     Health Maintenance  Topic Date Due  . COVID-19 Vaccine (1) 12/21/2020 (Originally 01/17/1970)  . Hepatitis C Screening  12/05/2021 (Originally [birth year]01[birth year]1964)  . HIV Screening  12/05/2021 (Originally 01/18/1980)  . MAMMOGRAM  11/10/2021  . TETANUS/TDAP  03/09/2022  . COLONOSCOPY (Pts 45-70yrs Insurance coverage will need to be confirmed)  02/01/2025  . INFLUENZA VACCINE  Completed      Review of Systems Patient reports no vision/ hearing changes, adenopathy,fever, weight change,  persistant/recurrent hoarseness , swallowing issues, chest pain, palpitations, edema, persistant/recurrent cough, hemoptysis, dyspnea (rest/exertional/paroxysmal nocturnal), gastrointestinal bleeding (melena, rectal bleeding), abdominal pain, significant heartburn, bowel changes, GU symptoms (dysuria, hematuria, incontinence), Gyn symptoms (abnormal  bleeding, pain),  syncope, focal weakness, memory loss, numbness & tingling, skin/hair/nail changes, abnormal bruising or bleeding.   Anxiety/Depression- pt scored 15 on PHQ9.  On Celexa 40mg  daily.  Lost her nephew last summer to suicide.  This brought up memories (PTSD) of her son's death (overdose).  + fatigue, low motivation.  'I just want to cry all the time', 'it's all so overwhelming'.  This visit occurred during the SARS-CoV-2 public health emergency.  Safety protocols were in place, including screening questions prior to the visit, additional usage of staff PPE, and extensive cleaning of exam room  while observing appropriate contact time as indicated for disinfecting solutions.       Objective:   Physical Exam General Appearance:    Alert, cooperative, no distress, appears stated age  Head:    Normocephalic, without obvious abnormality, atraumatic  Eyes:    PERRL, conjunctiva/corneas clear, EOM's intact, fundi    benign, both eyes  Ears:    Normal TM's and external ear canals, both ears  Nose:   Deferred due to COVID  Throat:   Neck:   Supple, symmetrical, trachea midline, no adenopathy;    Thyroid: no enlargement/tenderness/nodules  Back:     Symmetric, no curvature, ROM normal, no CVA tenderness  Lungs:     Clear to auscultation bilaterally, respirations unlabored  Chest Wall:    No tenderness or deformity   Heart:    Regular rate and rhythm, S1 and S2 normal, no murmur, rub   or gallop  Breast Exam:    Deferred to GYN  Abdomen:     Soft, non-tender, bowel sounds active all four quadrants,    no masses, no organomegaly  Genitalia:    Deferred to GYN  Rectal:    Extremities:   Extremities normal, atraumatic, no cyanosis or edema  Pulses:   2+ and symmetric all extremities  Skin:   Skin color, texture, turgor normal, no rashes or lesions  Lymph nodes:   Cervical, supraclavicular, and axillary nodes normal  Neurologic:   CNII-XII intact, normal strength, sensation and reflexes    throughout          Assessment & Plan:

## 2020-12-05 NOTE — Patient Instructions (Addendum)
Follow up in 1 month to recheck mood No need to repeat labs- yay! ADD Wellbutrin once daily and continue the Citalopram once daily Continue to work on healthy diet and regular exercise- you can do it! Call with any questions or concerns Stay Safe!  Stay Healthy! Enjoy your trip!!!

## 2020-12-05 NOTE — Assessment & Plan Note (Signed)
Deteriorated.  Pt scored a 15 on her PHQ9 today.  Struggling w/ low energy, low motivation, sadness, feeling overwhelmed.  Will add Wellbutrin to her current Celexa 40mg  daily and continue to monitor closely.  Will follow.

## 2020-12-24 ENCOUNTER — Other Ambulatory Visit: Payer: Self-pay | Admitting: Family Medicine

## 2020-12-24 DIAGNOSIS — F419 Anxiety disorder, unspecified: Secondary | ICD-10-CM

## 2020-12-24 DIAGNOSIS — F32A Depression, unspecified: Secondary | ICD-10-CM

## 2020-12-26 NOTE — Telephone Encounter (Signed)
Xanax last rx 09/26/20 #60 1 RF LOV: 12/05/20 CPE

## 2021-01-02 ENCOUNTER — Other Ambulatory Visit: Payer: Self-pay

## 2021-01-02 ENCOUNTER — Ambulatory Visit: Payer: Commercial Managed Care - PPO | Admitting: Family Medicine

## 2021-01-02 ENCOUNTER — Encounter: Payer: Self-pay | Admitting: Family Medicine

## 2021-01-02 VITALS — BP 118/80 | HR 67 | Temp 99.2°F | Resp 20 | Ht 67.0 in | Wt 175.2 lb

## 2021-01-02 DIAGNOSIS — F419 Anxiety disorder, unspecified: Secondary | ICD-10-CM | POA: Diagnosis not present

## 2021-01-02 DIAGNOSIS — F32A Depression, unspecified: Secondary | ICD-10-CM | POA: Diagnosis not present

## 2021-01-02 MED ORDER — BUPROPION HCL ER (XL) 150 MG PO TB24
150.0000 mg | ORAL_TABLET | Freq: Every day | ORAL | 3 refills | Status: DC
Start: 2021-01-02 — End: 2022-01-29

## 2021-01-02 NOTE — Patient Instructions (Signed)
Follow up in 1 year or as needed No med changes at this time Keep up the good work!  You look great! Call with any questions or concerns Stay Safe!  Stay Healthy! Happy Early Rudene Anda!!!

## 2021-01-02 NOTE — Progress Notes (Signed)
   Subjective:    Patient ID: Rebecca Gallegos, female    DOB: [birth year], 55 y.o.   MRN: [id number]  HPI Depression- at last visit we added Wellbutrin XL 150mg  daily to her Celexa 40mg  daily.  Pt reports feeling 'better'.  'the wellbutrin is doing very well'.  Sleeping better.  Less overwhelmed, less stressed.  No noticed side effects.  No longer having sense of 'doom and dread'   Review of Systems For ROS see HPI   This visit occurred during the SARS-CoV-2 public health emergency.  Safety protocols were in place, including screening questions prior to the visit, additional usage of staff PPE, and extensive cleaning of exam room while observing appropriate contact time as indicated for disinfecting solutions.       Objective:   Physical Exam Vitals reviewed.  Constitutional:      General: She is not in acute distress.    Appearance: Normal appearance. She is not ill-appearing.  HENT:     Head: Normocephalic and atraumatic.  Eyes:     Extraocular Movements: Extraocular movements intact.     Conjunctiva/sclera: Conjunctivae normal.     Pupils: Pupils are equal, round, and reactive to light.  Neurological:     General: No focal deficit present.     Mental Status: She is alert and oriented to person, place, and time.     Cranial Nerves: No cranial nerve deficit.     Motor: No weakness.  Psychiatric:        Mood and Affect: Mood normal.        Behavior: Behavior normal.        Thought Content: Thought content normal.           Assessment & Plan:

## 2021-01-02 NOTE — Assessment & Plan Note (Signed)
Much improved w/ addition of Wellbutrin.  No noted side effects at this time.  Will continue at 150mg  daily in addition to the Celexa 40mg  daily.  Will continue to follow.

## 2021-02-21 ENCOUNTER — Other Ambulatory Visit: Payer: Self-pay | Admitting: Family Medicine

## 2021-03-14 ENCOUNTER — Other Ambulatory Visit: Payer: Self-pay | Admitting: Family Medicine

## 2021-03-15 ENCOUNTER — Other Ambulatory Visit: Payer: Self-pay | Admitting: Family Medicine

## 2021-03-15 DIAGNOSIS — F419 Anxiety disorder, unspecified: Secondary | ICD-10-CM

## 2021-03-15 DIAGNOSIS — F32A Depression, unspecified: Secondary | ICD-10-CM

## 2021-03-16 NOTE — Telephone Encounter (Signed)
LFD 12/26/20 #60 with 1 refill LOV 01/02/21 NOV 01/03/22

## 2021-04-04 ENCOUNTER — Encounter: Payer: Self-pay | Admitting: Family Medicine

## 2021-04-19 ENCOUNTER — Encounter: Payer: Self-pay | Admitting: *Deleted

## 2021-05-12 ENCOUNTER — Telehealth: Payer: Self-pay | Admitting: Family Medicine

## 2021-05-15 NOTE — Telephone Encounter (Signed)
Encounter started in error.

## 2021-05-19 ENCOUNTER — Ambulatory Visit (INDEPENDENT_AMBULATORY_CARE_PROVIDER_SITE_OTHER): Payer: Commercial Managed Care - PPO

## 2021-05-19 ENCOUNTER — Other Ambulatory Visit: Payer: Self-pay

## 2021-05-19 DIAGNOSIS — Z23 Encounter for immunization: Secondary | ICD-10-CM

## 2021-05-19 NOTE — Progress Notes (Signed)
[email or web address]

## 2021-05-24 ENCOUNTER — Other Ambulatory Visit: Payer: Self-pay

## 2021-05-24 ENCOUNTER — Ambulatory Visit: Payer: Self-pay

## 2021-05-24 ENCOUNTER — Ambulatory Visit: Payer: Commercial Managed Care - PPO | Admitting: Orthopaedic Surgery

## 2021-05-24 ENCOUNTER — Encounter: Payer: Self-pay | Admitting: Orthopaedic Surgery

## 2021-05-24 VITALS — BP 122/84 | HR 65 | Ht 66.0 in | Wt 171.0 lb

## 2021-05-24 DIAGNOSIS — M25561 Pain in right knee: Secondary | ICD-10-CM | POA: Diagnosis not present

## 2021-05-24 DIAGNOSIS — M25552 Pain in left hip: Secondary | ICD-10-CM | POA: Diagnosis not present

## 2021-05-24 DIAGNOSIS — M25551 Pain in right hip: Secondary | ICD-10-CM | POA: Diagnosis not present

## 2021-05-24 DIAGNOSIS — G8929 Other chronic pain: Secondary | ICD-10-CM

## 2021-05-24 DIAGNOSIS — M25562 Pain in left knee: Secondary | ICD-10-CM

## 2021-05-26 NOTE — Progress Notes (Signed)
Office Visit Note   Patient: Rebecca Gallegos           Date of Birth: [birth year]           MRN: [id number] Visit Date: 05/24/2021              Requested by: Midge Minium, MD [address] [place],  [place] [address] PCP: Midge Minium, MD   Assessment & Plan: Visit Diagnoses:  1. Bilateral hip pain   2. Chronic pain of both knees     Plan: No evidence radiculopathy.  She has some chondromalacia on the right knee with crepitus but no pain swelling or giving way and good patellar tracking.  We discussed activity modification quadricep strengthening exercises.  Return as needed.  Follow-Up Instructions: Return if symptoms worsen or fail to improve.   Orders:  Orders Placed This Encounter  Procedures   XR HIP UNILAT W OR W/O PELVIS 2-3 VIEWS RIGHT   XR KNEE 3 VIEW RIGHT   No orders of the defined types were placed in this encounter.     Procedures: No procedures performed   Clinical Data: No additional findings.   Subjective: Chief Complaint  Patient presents with   Right Knee - Pain   Left Knee - Pain   Left Hip - Pain   Right Hip - Pain    HPI 55 year old female long-term patient seen with bilateral knee discomfort with popping in her knee but not really true pain.  She has had some discomfort laterally in her hips.  She notes noise when she gets from sitting standing right knee only.  She was carrying something weighed more than 30 pounds and she felt like her right hip was going to give out.  No numbness or tingling in her feet.  She is taken some Aleve twice daily with some improvement.  Review of Systems all the systems noncontributory to HPI.   Objective: Vital Signs: BP 122/84   Pulse 65   Ht '5\' 6"'$  (1.676 m)   Wt 171 lb (77.6 kg)   LMP 01/18/2014   BMI 27.60 kg/m   Physical Exam Constitutional:      Appearance: She is well-developed.  HENT:     Head: Normocephalic.     Right Ear: External ear normal.     Left Ear:  External ear normal. There is no impacted cerumen.  Eyes:     Pupils: Pupils are equal, round, and reactive to light.  Neck:     Thyroid: No thyromegaly.     Trachea: No tracheal deviation.  Cardiovascular:     Rate and Rhythm: Normal rate.  Pulmonary:     Effort: Pulmonary effort is normal.  Abdominal:     Palpations: Abdomen is soft.  Musculoskeletal:     Cervical back: No rigidity.  Skin:    General: Skin is warm and dry.  Neurological:     Mental Status: She is alert and oriented to person, place, and time.  Psychiatric:        Behavior: Behavior normal.    Ortho Exam patient has some crepitus with knee extension normal talar tracking.  No knee effusion collateral crucial ligament exam is normal negative logroll the hips.  Mild tenderness right trochanter slightly more than left.  She is able to heel and toe walk. Healed lumbar incision 2009 microdiscectomy L4-5. Specialty Comments:  No specialty comments available.  Imaging: No results found.   PMFS History: Patient Active Problem  List   Diagnosis Date Noted   Impaired fasting glucose 12/05/2020   Postoperative hypothyroidism 12/05/2020   Pure hypercholesterolemia 12/05/2020   Overweight (BMI 25.0-29.9) 12/05/2020   Other intervertebral disc degeneration, lumbar region 03/11/2020   Bleeding hemorrhoid 10/21/2018   Palpitations 07/23/2018   Chronic pain of right knee 05/07/2018   Bilateral hip pain 05/07/2018   Physical exam 07/19/2017   Pre-diabetes 03/09/2016   S/P laparoscopic assisted vaginal hysterectomy (LAVH) 05/06/2015   S/P BSO (bilateral salpingo-oophorectomy) 05/06/2015   S/P endometrial ablation 04/06/2015   Family history of ovarian cancer 04/06/2015   Family history of malignant neoplasm of gastrointestinal tract 03/05/2014   Family history of malignant neoplasm of breast 03/05/2014   Chronic cough 11/22/2013   Hypothyroidism 11/27/2012   Anxiety and depression 11/27/2012   Anemia 11/27/2012    Past Medical History:  Diagnosis Date   Anemia    Anxiety    hx- has gone away since thyroid level controlled   Family history of ovarian cancer 04/06/2015   Female pelvic pain 04/06/2015   Hematometra 05/05/2015   Hypothyroidism    S/P BSO (bilateral salpingo-oophorectomy) 05/06/2015   S/P endometrial ablation 04/06/2015   S/P laparoscopic assisted vaginal hysterectomy (LAVH) 05/06/2015   Wears glasses     Family History  Problem Relation Age of Onset   Ovarian cancer Mother    Mental illness Mother    Diabetes Mother    Breast cancer Mother 27   Heart disease Maternal Grandfather    Cancer Maternal Grandfather 73       colon   Cancer Maternal Aunt 32       blood cancer; unknown type   Breast cancer Maternal Aunt    Cancer Maternal Grandmother 85       breast   Breast cancer Maternal Grandmother 85   Cancer Maternal Aunt    Breast cancer Sister    Esophageal cancer Neg Hx    Rectal cancer Neg Hx    Stomach cancer Neg Hx     Past Surgical History:  Procedure Laterality Date   BACK SURGERY  2009   BRONCHIAL WASHINGS  07/04/2020   Procedure: BRONCHIAL WASHINGS;  Surgeon: Collene Gobble, MD;  Location: [facility];  Service: Cardiopulmonary;;   CERVICAL SPINE SURGERY     LAPAROSCOPIC ASSISTED VAGINAL HYSTERECTOMY N/A 05/06/2015   Procedure: LAPAROSCOPIC ASSISTED VAGINAL HYSTERECTOMY;  Surgeon: Janyth Contes, MD;  Location: [facility];  Service: Gynecology;  Laterality: N/A;   SALPINGOOPHORECTOMY Bilateral 05/06/2015   Procedure: SALPINGO OOPHORECTOMY;  Surgeon: Janyth Contes, MD;  Location: [facility];  Service: Gynecology;  Laterality: Bilateral;   THYROIDECTOMY     TUBAL LIGATION  1991   bilateral   VIDEO BRONCHOSCOPY N/A 07/04/2020   Procedure: VIDEO BRONCHOSCOPY WITHOUT FLUORO;  Surgeon: Collene Gobble, MD;  Location: [facility];  Service: Cardiopulmonary;  Laterality: N/A;   Social History   Occupational History   Not on file  Tobacco Use   Smoking  status: Former    Packs/day: 1.00    Years: 7.00    Pack years: 7.00    Types: Cigarettes    Quit date: 10/22/1996    Years since quitting: 24.6   Smokeless tobacco: Never  Vaping Use   Vaping Use: Never used  Substance and Sexual Activity   Alcohol use: Yes    Alcohol/week: 7.0 standard drinks    Types: 7 Glasses of wine per week    Comment: daily- wine   Drug use: No  Sexual activity: Not on file

## 2021-06-02 ENCOUNTER — Other Ambulatory Visit: Payer: Self-pay | Admitting: Family Medicine

## 2021-06-02 DIAGNOSIS — F32A Depression, unspecified: Secondary | ICD-10-CM

## 2021-06-02 DIAGNOSIS — F419 Anxiety disorder, unspecified: Secondary | ICD-10-CM

## 2021-06-02 NOTE — Telephone Encounter (Signed)
LFD 03/16/21 #60 with 1 refill LOV 01/02/21 NOV 01/03/22

## 2021-06-27 ENCOUNTER — Ambulatory Visit: Payer: Commercial Managed Care - PPO

## 2021-07-21 ENCOUNTER — Ambulatory Visit (INDEPENDENT_AMBULATORY_CARE_PROVIDER_SITE_OTHER): Payer: Commercial Managed Care - PPO | Admitting: Family Medicine

## 2021-07-21 ENCOUNTER — Other Ambulatory Visit: Payer: Self-pay

## 2021-07-21 DIAGNOSIS — Z23 Encounter for immunization: Secondary | ICD-10-CM

## 2021-07-25 ENCOUNTER — Telehealth: Payer: Self-pay

## 2021-07-25 NOTE — Telephone Encounter (Signed)
Was last seen in March, told to follow up in one year. [place] for a nurse visit for the hep c screening along with her flu shot?

## 2021-07-25 NOTE — Telephone Encounter (Signed)
Pt has sent a online request for a Hep C Screening can orders be put in for this or will we need to schedule a office visit. She wants to schedule a Flu shot as well and I can schedule that.

## 2021-07-27 ENCOUNTER — Ambulatory Visit (INDEPENDENT_AMBULATORY_CARE_PROVIDER_SITE_OTHER): Payer: Commercial Managed Care - PPO | Admitting: Family Medicine

## 2021-07-27 ENCOUNTER — Other Ambulatory Visit: Payer: Self-pay

## 2021-07-27 DIAGNOSIS — Z23 Encounter for immunization: Secondary | ICD-10-CM

## 2021-07-27 NOTE — Telephone Encounter (Signed)
[place] for Hep C screening lab to be done at time of flu shot

## 2021-07-27 NOTE — Telephone Encounter (Signed)
She's going to wait when she comes in for her physical.

## 2021-08-03 ENCOUNTER — Other Ambulatory Visit: Payer: Self-pay

## 2021-08-03 DIAGNOSIS — F419 Anxiety disorder, unspecified: Secondary | ICD-10-CM

## 2021-08-03 MED ORDER — ALPRAZOLAM 0.5 MG PO TABS: ORAL_TABLET | ORAL | 1 refills | Status: DC

## 2021-08-03 NOTE — Telephone Encounter (Signed)
Requesting:Xanax 0.5mg  Contract: UDS: Last Visit:07/27/21 Next Visit:01/03/22 Last Refill:06/02/21 60 tabs 1 refill  Please Advise

## 2021-08-15 ENCOUNTER — Other Ambulatory Visit: Payer: Self-pay | Admitting: Family Medicine

## 2021-09-12 NOTE — Progress Notes (Signed)
Rebecca Gallegos is a 55 y.o. female presents to the office today for shingles shot injections, per physician's orders.   Rebecca Gallegos

## 2021-10-12 ENCOUNTER — Other Ambulatory Visit: Payer: Self-pay | Admitting: Family Medicine

## 2021-10-12 DIAGNOSIS — Z1231 Encounter for screening mammogram for malignant neoplasm of breast: Secondary | ICD-10-CM

## 2021-10-24 ENCOUNTER — Ambulatory Visit: Payer: Commercial Managed Care - PPO | Admitting: Orthopaedic Surgery

## 2021-10-24 ENCOUNTER — Other Ambulatory Visit: Payer: Self-pay

## 2021-10-24 ENCOUNTER — Ambulatory Visit: Payer: Self-pay

## 2021-10-24 ENCOUNTER — Encounter: Payer: Self-pay | Admitting: Orthopaedic Surgery

## 2021-10-24 VITALS — BP 110/76 | HR 76 | Ht 66.0 in | Wt 165.0 lb

## 2021-10-24 DIAGNOSIS — M545 Low back pain, unspecified: Secondary | ICD-10-CM | POA: Diagnosis not present

## 2021-10-24 MED ORDER — METHOCARBAMOL 500 MG PO TABS: 500.0000 mg | ORAL_TABLET | Freq: Three times a day (TID) | ORAL | 1 refills | Status: DC | PRN

## 2021-10-24 MED ORDER — PREDNISONE 5 MG (21) PO TBPK: ORAL_TABLET | ORAL | 0 refills | Status: DC

## 2021-10-24 NOTE — Progress Notes (Signed)
Office Visit Note   Patient: Rebecca Gallegos           Date of Birth: [birth year]           MRN: [id number] Visit Date: 10/24/2021              Requested by: Midge Minium, MD [address] [place],  [place] [address] PCP: Midge Minium, MD   Assessment & Plan: Visit Diagnoses:  1. Acute right-sided low back pain, unspecified whether sciatica present     Plan: Place patient on prednisone Dosepak 5 mg since she has had a history of borderline diabetes controlled with diet.  Recheck 3 weeks.  She is working from home and work note given light duty at home x2 to 3 days due to her back spasms and she can turn this in if needed.  Follow-Up Instructions: Return in about 3 weeks (around 11/14/2021).   Orders:  Orders Placed This Encounter  Procedures   XR Lumbar Spine 2-3 Views   Meds ordered this encounter  Medications   predniSONE (STERAPRED UNI-PAK 21 TAB) 5 MG (21) TBPK tablet    Sig: Take 6,5,4,3,2,1 one tablet less each day with food until gone.    Dispense:  21 tablet    Refill:  0   methocarbamol (ROBAXIN) 500 MG tablet    Sig: Take 1 tablet (500 mg total) by mouth every 8 (eight) hours as needed for muscle spasms.    Dispense:  30 tablet    Refill:  1      Procedures: No procedures performed   Clinical Data: No additional findings.   Subjective: Chief Complaint  Patient presents with   Lower Back - Pain    HPI 56 year old female now 51 years post right L4-5 microdiscectomy by me with recurrence of back pain when she was lifting some objects up with help of her husband putting him up in the attic lifting overhead when she felt sharp back pain and spasms radiating into her right buttocks into her leg.  She had some Flexeril from a dental procedure she also took some Aleve.  She is used a hot bath, heating pad recliner position, fetal position in bed etc. trying to get some relief.  Previous Cloward fusion of her neck doing well.  Of note was  May 2021 when she had similar foot flare which responded to 5 mg prednisone Dosepak.  Review of Systems 14 point update unchanged from 03/11/2020 office visit other than as mentioned above.   Objective: Vital Signs: BP 110/76    Pulse 76    Ht 5\' 6"  (1.676 m)    Wt 165 lb (74.8 kg)    LMP 01/18/2014    BMI 26.63 kg/m   Physical Exam Constitutional:      Appearance: She is well-developed.  HENT:     Head: Normocephalic.     Right Ear: External ear normal.     Left Ear: External ear normal. There is no impacted cerumen.  Eyes:     Pupils: Pupils are equal, round, and reactive to light.  Neck:     Thyroid: No thyromegaly.     Trachea: No tracheal deviation.  Cardiovascular:     Rate and Rhythm: Normal rate.  Pulmonary:     Effort: Pulmonary effort is normal.  Abdominal:     Palpations: Abdomen is soft.  Musculoskeletal:     Cervical back: No rigidity.  Skin:    General: Skin is  warm and dry.  Neurological:     Mental Status: She is alert and oriented to person, place, and time.  Psychiatric:        Behavior: Behavior normal.    Ortho Exam well-healed lumbar incision a few millimeters off midline right L4-5.  Minimal sciatic notch tenderness no trochanteric bursal tenderness.  SI joint testing is negative some discomfort straight leg raising at 90 degrees.  Anterior tib EHL is intact no sensory deficit to the foot.  Specialty Comments:  No specialty comments available.  Imaging: No results found.   PMFS History: Patient Active Problem List   Diagnosis Date Noted   Impaired fasting glucose 12/05/2020   Postoperative hypothyroidism 12/05/2020   Pure hypercholesterolemia 12/05/2020   Overweight (BMI 25.0-29.9) 12/05/2020   Other intervertebral disc degeneration, lumbar region 03/11/2020   Bleeding hemorrhoid 10/21/2018   Palpitations 07/23/2018   Chronic pain of right knee 05/07/2018   Bilateral hip pain 05/07/2018   Physical exam 07/19/2017   Pre-diabetes  03/09/2016   S/P laparoscopic assisted vaginal hysterectomy (LAVH) 05/06/2015   S/P BSO (bilateral salpingo-oophorectomy) 05/06/2015   S/P endometrial ablation 04/06/2015   Family history of ovarian cancer 04/06/2015   Family history of malignant neoplasm of gastrointestinal tract 03/05/2014   Family history of malignant neoplasm of breast 03/05/2014   Chronic cough 11/22/2013   Hypothyroidism 11/27/2012   Anxiety and depression 11/27/2012   Anemia 11/27/2012   Past Medical History:  Diagnosis Date   Anemia    Anxiety    hx- has gone away since thyroid level controlled   Family history of ovarian cancer 04/06/2015   Female pelvic pain 04/06/2015   Hematometra 05/05/2015   Hypothyroidism    S/P BSO (bilateral salpingo-oophorectomy) 05/06/2015   S/P endometrial ablation 04/06/2015   S/P laparoscopic assisted vaginal hysterectomy (LAVH) 05/06/2015   Wears glasses     Family History  Problem Relation Age of Onset   Ovarian cancer Mother    Mental illness Mother    Diabetes Mother    Breast cancer Mother 36   Heart disease Maternal Grandfather    Cancer Maternal Grandfather 31       colon   Cancer Maternal Aunt 30       blood cancer; unknown type   Breast cancer Maternal Aunt    Cancer Maternal Grandmother 85       breast   Breast cancer Maternal Grandmother 85   Cancer Maternal Aunt    Breast cancer Sister    Esophageal cancer Neg Hx    Rectal cancer Neg Hx    Stomach cancer Neg Hx     Past Surgical History:  Procedure Laterality Date   BACK SURGERY  2009   BRONCHIAL WASHINGS  07/04/2020   Procedure: BRONCHIAL WASHINGS;  Surgeon: Collene Gobble, MD;  Location: [facility];  Service: Cardiopulmonary;;   CERVICAL SPINE SURGERY     LAPAROSCOPIC ASSISTED VAGINAL HYSTERECTOMY N/A 05/06/2015   Procedure: LAPAROSCOPIC ASSISTED VAGINAL HYSTERECTOMY;  Surgeon: Janyth Contes, MD;  Location: [facility];  Service: Gynecology;  Laterality: N/A;   SALPINGOOPHORECTOMY Bilateral  05/06/2015   Procedure: SALPINGO OOPHORECTOMY;  Surgeon: Janyth Contes, MD;  Location: [facility];  Service: Gynecology;  Laterality: Bilateral;   THYROIDECTOMY     TUBAL LIGATION  1991   bilateral   VIDEO BRONCHOSCOPY N/A 07/04/2020   Procedure: VIDEO BRONCHOSCOPY WITHOUT FLUORO;  Surgeon: Collene Gobble, MD;  Location: [facility];  Service: Cardiopulmonary;  Laterality: N/A;   Social History  Occupational History   Not on file  Tobacco Use   Smoking status: Former    Packs/day: 1.00    Years: 7.00    Pack years: 7.00    Types: Cigarettes    Quit date: 10/22/1996    Years since quitting: 25.0   Smokeless tobacco: Never  Vaping Use   Vaping Use: Never used  Substance and Sexual Activity   Alcohol use: Yes    Alcohol/week: 7.0 standard drinks    Types: 7 Glasses of wine per week    Comment: daily- wine   Drug use: No   Sexual activity: Not on file

## 2021-11-07 ENCOUNTER — Other Ambulatory Visit: Payer: Self-pay | Admitting: Family Medicine

## 2021-11-07 DIAGNOSIS — F419 Anxiety disorder, unspecified: Secondary | ICD-10-CM

## 2021-11-13 ENCOUNTER — Ambulatory Visit
Admission: RE | Admit: 2021-11-13 | Discharge: 2021-11-13 | Disposition: A | Discharge status: Home / Self Care | Payer: Commercial Managed Care - PPO | Source: Ambulatory Visit | Attending: Family Medicine | Admitting: Family Medicine

## 2021-11-13 DIAGNOSIS — Z1231 Encounter for screening mammogram for malignant neoplasm of breast: Secondary | ICD-10-CM

## 2021-11-16 ENCOUNTER — Telehealth: Payer: Self-pay

## 2021-11-16 NOTE — Telephone Encounter (Signed)
Pt called into the office stating that her back is acting up again and she has a few muscle relaxer's she wanted to know if Dr. Lorin Mercy needed to see her again or what she needed to do ?

## 2021-11-16 NOTE — Telephone Encounter (Signed)
Could you please call patient and make return appt? Per last office note, he wanted to recheck her in 3 weeks.  You can work her in to schedule on Tuesday morning if she can make it. Thanks.

## 2021-11-17 ENCOUNTER — Encounter: Payer: Self-pay | Admitting: Orthopaedic Surgery

## 2022-01-03 ENCOUNTER — Encounter: Payer: Self-pay | Admitting: Family Medicine

## 2022-01-03 ENCOUNTER — Ambulatory Visit (INDEPENDENT_AMBULATORY_CARE_PROVIDER_SITE_OTHER): Payer: Commercial Managed Care - PPO | Admitting: Family Medicine

## 2022-01-03 VITALS — BP 132/80 | HR 66 | Temp 98.1°F | Resp 16 | Ht 66.0 in | Wt 175.2 lb

## 2022-01-03 DIAGNOSIS — Z114 Encounter for screening for human immunodeficiency virus [HIV]: Secondary | ICD-10-CM

## 2022-01-03 DIAGNOSIS — Z0000 Encounter for general adult medical examination without abnormal findings: Secondary | ICD-10-CM

## 2022-01-03 DIAGNOSIS — Z23 Encounter for immunization: Secondary | ICD-10-CM

## 2022-01-03 DIAGNOSIS — Z1159 Encounter for screening for other viral diseases: Secondary | ICD-10-CM

## 2022-01-03 DIAGNOSIS — E663 Overweight: Secondary | ICD-10-CM

## 2022-01-03 LAB — BASIC METABOLIC PANEL
BUN: 17 mg/dL (ref 6–23)
CO2: 30 mEq/L (ref 19–32)
Calcium: 9.2 mg/dL (ref 8.4–10.5)
Chloride: 102 mEq/L (ref 96–112)
Creatinine, Ser: 0.63 mg/dL (ref 0.40–1.20)
GFR: 98.79 mL/min (ref >60.00)
Glucose, Bld: 107 mg/dL — ABNORMAL HIGH (ref 70–99)
Potassium: 4 mEq/L (ref 3.5–5.1)
Sodium: 138 mEq/L (ref 135–145)

## 2022-01-03 LAB — CBC WITH DIFFERENTIAL/PLATELET
Basophils Absolute: 0.1 10*3/uL (ref 0.0–0.1)
Basophils Relative: 1.3 % (ref 0.0–3.0)
Eosinophils Absolute: 0.1 10*3/uL (ref 0.0–0.7)
Eosinophils Relative: 2.1 % (ref 0.0–5.0)
HCT: 39.8 % (ref 36.0–46.0)
Hemoglobin: 13.4 g/dL (ref 12.0–15.0)
Lymphocytes Relative: 38.6 % (ref 12.0–46.0)
Lymphs Abs: 1.7 10*3/uL (ref 0.7–4.0)
MCHC: 33.6 g/dL (ref 30.0–36.0)
MCV: 86.8 fl (ref 78.0–100.0)
Monocytes Absolute: 0.3 10*3/uL (ref 0.1–1.0)
Monocytes Relative: 6.9 % (ref 3.0–12.0)
Neutro Abs: 2.3 10*3/uL (ref 1.4–7.7)
Neutrophils Relative %: 51.1 % (ref 43.0–77.0)
Platelets: 200 10*3/uL (ref 150.0–400.0)
RBC: 4.58 Mil/uL (ref 3.87–5.11)
RDW: 13.3 % (ref 11.5–15.5)
WBC: 4.4 10*3/uL (ref 4.0–10.5)

## 2022-01-03 LAB — TSH: TSH: 1.09 u[IU]/mL (ref 0.35–5.50)

## 2022-01-03 LAB — HEPATIC FUNCTION PANEL
ALT: 13 U/L (ref 0–35)
AST: 13 U/L (ref 0–37)
Albumin: 4.7 g/dL (ref 3.5–5.2)
Alkaline Phosphatase: 49 U/L (ref 39–117)
Bilirubin, Direct: 0.1 mg/dL (ref 0.0–0.3)
Total Bilirubin: 0.5 mg/dL (ref 0.2–1.2)
Total Protein: 7.2 g/dL (ref 6.0–8.3)

## 2022-01-03 LAB — LIPID PANEL
Cholesterol: 216 mg/dL — ABNORMAL HIGH (ref 0–200)
HDL: 70 mg/dL (ref >39.00)
LDL Cholesterol: 134 mg/dL — ABNORMAL HIGH (ref 0–99)
NonHDL: 146.15
Total CHOL/HDL Ratio: 3
Triglycerides: 61 mg/dL (ref 0.0–149.0)
VLDL: 12.2 mg/dL (ref 0.0–40.0)

## 2022-01-03 NOTE — Addendum Note (Signed)
Addended by: Juliann Pulse on: 01/03/2022 01:15 PM ? ? Modules accepted: Orders ? ?

## 2022-01-03 NOTE — Patient Instructions (Addendum)
Follow up in 1 year or as needed We'll notify you of your lab results and make any changes if needed Keep up the good work on healthy diet and regular exercise- you look great! Call with any questions or concerns Stay Safe!  Stay Healthy! Happy Early Birthday!!! 

## 2022-01-03 NOTE — Assessment & Plan Note (Signed)
Pt is working on [employer] and regular exercise.  Applauded her efforts.  Check labs to risk stratify.  Will follow. ?

## 2022-01-03 NOTE — Assessment & Plan Note (Signed)
Pt's PE WNL.  UTD on mammo, colonoscopy, flu shingles.  Tdap updated today.  Check labs.  Anticipatory guidance provided.  ?

## 2022-01-03 NOTE — Progress Notes (Signed)
? ?  Subjective:  ? ? Patient ID: Rebecca Gallegos, female    DOB: 24-Mar-1966, 56 y.o.   MRN: [id number] ? ?HPI ?CPE- UTD on mammo, colonoscopy, flu, shingles.  Wants to update Tdap ? ?Patient Care Team  ?  Relationship Specialty Notifications Start End  ?Midge Minium, MD PCP - General Family Medicine  03/12/16   ?Jacelyn Pi, MD Consulting Physician Endocrinology  07/31/17   ?  ?Health Maintenance  ?Topic Date Due  ? HIV Screening  Never done  ? Hepatitis C Screening  Never done  ? TETANUS/TDAP  03/09/2022  ? MAMMOGRAM  11/13/2022  ? COLONOSCOPY (Pts 45-93yr Insurance coverage will need to be confirmed)  02/01/2025  ? INFLUENZA VACCINE  Completed  ? Zoster Vaccines- Shingrix  Completed  ? HPV VACCINES  Aged Out  ? COVID-19 Vaccine  Discontinued  ?  ? ? ?Review of Systems ?Patient reports no vision/ hearing changes, adenopathy,fever, weight change,  persistant/recurrent hoarseness , swallowing issues, chest pain, palpitations, edema, persistant/recurrent cough, hemoptysis, dyspnea (rest/exertional/paroxysmal nocturnal), gastrointestinal bleeding (melena, rectal bleeding), abdominal pain, significant heartburn, bowel changes, GU symptoms (dysuria, hematuria, incontinence), Gyn symptoms (abnormal  bleeding, pain),  syncope, focal weakness, memory loss, numbness & tingling, skin/hair/nail changes, abnormal bruising or bleeding, anxiety, or depression.  ? ?This visit occurred during the SARS-CoV-2 public health emergency.  Safety protocols were in place, including screening questions prior to the visit, additional usage of staff PPE, and extensive cleaning of exam room while observing appropriate contact time as indicated for disinfecting solutions.   ?   ?Objective:  ? Physical Exam ?General Appearance:    Alert, cooperative, no distress, appears stated age  ?Head:    Normocephalic, without obvious abnormality, atraumatic  ?Eyes:    PERRL, conjunctiva/corneas clear, EOM's intact, fundi  ?  benign, both  eyes  ?Ears:    Normal TM's and external ear canals, both ears  ?Nose:   Nares normal, septum midline, mucosa normal, no drainage  ?  or sinus tenderness  ?Throat:   Lips, mucosa, and tongue normal; teeth and gums normal  ?Neck:   Supple, symmetrical, trachea midline, no adenopathy;  ?  Thyroid: no enlargement/tenderness/nodules  ?Back:     Symmetric, no curvature, ROM normal, no CVA tenderness  ?Lungs:     Clear to auscultation bilaterally, respirations unlabored  ?Chest Wall:    No tenderness or deformity  ? Heart:    Regular rate and rhythm, S1 and S2 normal, no murmur, rub ?  or gallop  ?Breast Exam:    Deferred to mammo  ?Abdomen:     Soft, non-tender, bowel sounds active all four quadrants,  ?  no masses, no organomegaly  ?Genitalia:    Deferred   ?Rectal:    ?Extremities:   Extremities normal, atraumatic, no cyanosis or edema  ?Pulses:   2+ and symmetric all extremities  ?Skin:   Skin color, texture, turgor normal, no rashes or lesions  ?Lymph nodes:   Cervical, supraclavicular, and axillary nodes normal  ?Neurologic:   CNII-XII intact, normal strength, sensation and reflexes  ?  throughout  ?  ? ? ? ?   ?Assessment & Plan:  ? ? ?

## 2022-01-04 LAB — HIV ANTIBODY (ROUTINE TESTING W REFLEX): HIV 1&2 Ab, 4th Generation: NONREACTIVE

## 2022-01-04 LAB — HEPATITIS C ANTIBODY
Hepatitis C Ab: NONREACTIVE
SIGNAL TO CUT-OFF: 0.03 (ref <1.00)

## 2022-01-14 ENCOUNTER — Other Ambulatory Visit: Payer: Self-pay | Admitting: Family Medicine

## 2022-01-14 DIAGNOSIS — F419 Anxiety disorder, unspecified: Secondary | ICD-10-CM

## 2022-01-28 ENCOUNTER — Other Ambulatory Visit: Payer: Self-pay | Admitting: Family Medicine

## 2022-02-05 ENCOUNTER — Encounter: Payer: Self-pay | Admitting: Family Medicine

## 2022-02-16 ENCOUNTER — Other Ambulatory Visit: Payer: Self-pay | Admitting: Family Medicine

## 2022-02-27 ENCOUNTER — Ambulatory Visit: Payer: Commercial Managed Care - PPO | Admitting: Family Medicine

## 2022-02-28 ENCOUNTER — Ambulatory Visit: Payer: Commercial Managed Care - PPO | Admitting: Family Medicine

## 2022-02-28 ENCOUNTER — Encounter: Payer: Self-pay | Admitting: Family Medicine

## 2022-02-28 ENCOUNTER — Ambulatory Visit (INDEPENDENT_AMBULATORY_CARE_PROVIDER_SITE_OTHER): Payer: Commercial Managed Care - PPO | Admitting: Family Medicine

## 2022-02-28 VITALS — BP 120/84 | HR 63 | Temp 97.6°F | Resp 16 | Ht 66.0 in | Wt 171.6 lb

## 2022-02-28 DIAGNOSIS — R42 Dizziness and giddiness: Secondary | ICD-10-CM

## 2022-02-28 DIAGNOSIS — R5383 Other fatigue: Secondary | ICD-10-CM | POA: Diagnosis not present

## 2022-02-28 LAB — CBC WITH DIFFERENTIAL/PLATELET
Basophils Absolute: 0.1 10*3/uL (ref 0.0–0.1)
Basophils Relative: 1.1 % (ref 0.0–3.0)
Eosinophils Absolute: 0.1 10*3/uL (ref 0.0–0.7)
Eosinophils Relative: 2.3 % (ref 0.0–5.0)
HCT: 42.1 % (ref 36.0–46.0)
Hemoglobin: 13.9 g/dL (ref 12.0–15.0)
Lymphocytes Relative: 38.6 % (ref 12.0–46.0)
Lymphs Abs: 1.8 10*3/uL (ref 0.7–4.0)
MCHC: 33.1 g/dL (ref 30.0–36.0)
MCV: 85.9 fl (ref 78.0–100.0)
Monocytes Absolute: 0.4 10*3/uL (ref 0.1–1.0)
Monocytes Relative: 8.2 % (ref 3.0–12.0)
Neutro Abs: 2.3 10*3/uL (ref 1.4–7.7)
Neutrophils Relative %: 49.8 % (ref 43.0–77.0)
Platelets: 224 10*3/uL (ref 150.0–400.0)
RBC: 4.9 Mil/uL (ref 3.87–5.11)
RDW: 13.6 % (ref 11.5–15.5)
WBC: 4.7 10*3/uL (ref 4.0–10.5)

## 2022-02-28 LAB — HEPATIC FUNCTION PANEL
ALT: 14 U/L (ref 0–35)
AST: 13 U/L (ref 0–37)
Albumin: 4.7 g/dL (ref 3.5–5.2)
Alkaline Phosphatase: 54 U/L (ref 39–117)
Bilirubin, Direct: 0.1 mg/dL (ref 0.0–0.3)
Total Bilirubin: 0.6 mg/dL (ref 0.2–1.2)
Total Protein: 7.3 g/dL (ref 6.0–8.3)

## 2022-02-28 LAB — TSH: TSH: 1.24 u[IU]/mL (ref 0.35–5.50)

## 2022-02-28 LAB — BASIC METABOLIC PANEL
BUN: 24 mg/dL — ABNORMAL HIGH (ref 6–23)
CO2: 31 mEq/L (ref 19–32)
Calcium: 9.5 mg/dL (ref 8.4–10.5)
Chloride: 103 mEq/L (ref 96–112)
Creatinine, Ser: 0.72 mg/dL (ref 0.40–1.20)
GFR: 93.01 mL/min (ref >60.00)
Glucose, Bld: 100 mg/dL — ABNORMAL HIGH (ref 70–99)
Potassium: 4.2 mEq/L (ref 3.5–5.1)
Sodium: 141 mEq/L (ref 135–145)

## 2022-02-28 LAB — VITAMIN D 25 HYDROXY (VIT D DEFICIENCY, FRACTURES): VITD: 37.96 ng/mL (ref 30.00–100.00)

## 2022-02-28 NOTE — Patient Instructions (Signed)
Follow up as needed or as scheduled ?We'll notify you of your lab results and make any changes if needed ?Make sure you are eating regularly throughout the day- even if it's small amounts or protein shakes ?Try and add more water and less caffeine ?Call with any questions or concerns ?Hang in there!!! ?ENJOY YOUR VACATION!!!! ?

## 2022-02-28 NOTE — Progress Notes (Signed)
? ?  Subjective:  ? ? Patient ID: Rebecca Gallegos, female    DOB: [birth year], 56 y.o.   MRN: [id number] ? ?HPI ?Lightheaded- pt reports in the last month she has had increased frequency of dizzy spells.  No recent change in medications.  Episodes will occur w/ both sitting and standing.  Denies vertigo.  Does not have sensation of vision closing or going black.  Doesn't feel sweaty or clammy.  Mild nausea at times.  Denies confusion.  Sxs resolve on their own.  Happening multiple times/day.  Pt is not eating regularly.  Pt reports she typically doesn't eat til dinner most days.  + fatigue.  HAs by day's end.  Pt has 3-4 cups of coffee each day, then drinks unsweetened ice tea for the rest of the day.   ? ? ?Review of Systems ?For ROS see HPI  ?   ?Objective:  ? Physical Exam ?Constitutional:   ?   General: She is not in acute distress. ?   Appearance: She is well-developed.  ?HENT:  ?   Head: Normocephalic and atraumatic.  ?Eyes:  ?   Conjunctiva/sclera: Conjunctivae normal.  ?   Pupils: Pupils are equal, round, and reactive to light.  ?Neck:  ?   Thyroid: No thyromegaly.  ?Cardiovascular:  ?   Rate and Rhythm: Normal rate and regular rhythm.  ?   Heart sounds: Normal heart sounds. No murmur heard. ?Pulmonary:  ?   Effort: Pulmonary effort is normal. No respiratory distress.  ?   Breath sounds: Normal breath sounds.  ?Abdominal:  ?   General: There is no distension.  ?   Palpations: Abdomen is soft.  ?   Tenderness: There is no abdominal tenderness.  ?Musculoskeletal:  ?   Cervical back: Normal range of motion and neck supple.  ?Lymphadenopathy:  ?   Cervical: No cervical adenopathy.  ?Skin: ?   General: Skin is warm and dry.  ?Neurological:  ?   Mental Status: She is alert and oriented to person, place, and time.  ?   Cranial Nerves: No cranial nerve deficit, dysarthria or facial asymmetry.  ?   Motor: No weakness.  ?   Gait: Gait normal.  ?   Deep Tendon Reflexes: Reflexes normal.  ?Psychiatric:     ?   Mood  and Affect: Mood normal.     ?   Behavior: Behavior normal.  ? ? ? ? ? ?   ?Assessment & Plan:  ?Light headed/fatigue- new.  Suspect that both sxs are related to fact that pt is not eating regularly- or at all- throughout the day.  There is likely a component of dehydration as well as pt only drinks beverages with caffeine until 3-4pm.  Discussed that while she doesn't need to eat large meals, she does need to eat regularly throughout the day to avoid low blood sugar- which can cause fatigue and make you feel lightheaded.  Will check labs to r/o underlying metabolic causes of fatigue and lightheadedness but these seem much less likely.  Pt in NSR today- no evidence of arrhythmia.   Pt reports she is leaving for vacation on Friday and she will be eating very regularly throughout her trip.  She will let me know if she feels any better w/ regular intake. ? ?

## 2022-03-01 ENCOUNTER — Telehealth: Payer: Self-pay

## 2022-03-01 NOTE — Telephone Encounter (Signed)
Spoke w/ pt and advised of lab results  

## 2022-03-01 NOTE — Telephone Encounter (Signed)
-----   Message from Midge Minium, MD sent at 03/01/2022  7:26 AM EDT ----- ?Labs look great!  No cause for feeling lightheaded found.  I do think it's your dietary patterns and will improve w/ more regular eating ?

## 2022-05-15 ENCOUNTER — Encounter: Payer: Self-pay | Admitting: Family Medicine

## 2022-05-17 ENCOUNTER — Encounter: Payer: Self-pay | Admitting: Family Medicine

## 2022-05-17 ENCOUNTER — Ambulatory Visit (INDEPENDENT_AMBULATORY_CARE_PROVIDER_SITE_OTHER): Payer: Commercial Managed Care - PPO | Admitting: Family Medicine

## 2022-05-17 VITALS — BP 120/82 | HR 79 | Temp 97.1°F | Resp 20 | Ht 66.0 in | Wt 162.4 lb

## 2022-05-17 DIAGNOSIS — F32A Depression, unspecified: Secondary | ICD-10-CM | POA: Diagnosis not present

## 2022-05-17 DIAGNOSIS — E663 Overweight: Secondary | ICD-10-CM

## 2022-05-17 DIAGNOSIS — F419 Anxiety disorder, unspecified: Secondary | ICD-10-CM | POA: Diagnosis not present

## 2022-05-17 NOTE — Assessment & Plan Note (Signed)
Improved.  Pt would like to try and wean off the Wellbutrin and the Citalopram and see how she does w/o the medication.  She is aware that she may not be able to wean if her mood worsens or she may have to restart in the future.  Will first wean the Wellbutrin and then the Citalopram.  Written instructions provided to pt in AVS.

## 2022-05-17 NOTE — Progress Notes (Signed)
   Subjective:    Patient ID: Rebecca Gallegos, female    DOB: 1966-02-06, 56 y.o.   MRN: [id number]  HPI Anxiety/Depression- chronic problem, on Wellbutrin XL '150mg'$  daily, Citalopram '40mg'$  daily.  Pt feels like she's in a good place and would like to try and come off medication.  Stopped taking Alprazolam a few weeks ago.    Overweight- pt started Saxenda per Endo on 7/3.  She reports she 'really likes' the medication.  Is also doing regular exercise.  Overall feeling good.  Review of Systems For ROS see HPI     Objective:   Physical Exam Vitals reviewed.  Constitutional:      General: She is not in acute distress.    Appearance: Normal appearance. She is not ill-appearing.  HENT:     Head: Normocephalic and atraumatic.  Skin:    General: Skin is warm and dry.  Neurological:     General: No focal deficit present.     Mental Status: She is alert and oriented to person, place, and time.  Psychiatric:        Mood and Affect: Mood normal.        Behavior: Behavior normal.        Thought Content: Thought content normal.           Assessment & Plan:

## 2022-05-17 NOTE — Assessment & Plan Note (Signed)
Pt started on Saxenda per her Endocrinologist on 7/3 and has dropped considerable weight.  She is tolerating the medicine w/o difficulty and overall reports feeling good.  Will follow.

## 2022-05-17 NOTE — Patient Instructions (Signed)
Follow up as needed or as scheduled Decrease the Wellbutrin to every other day x2-3 weeks.  Then decrease to every 3rd day x2-3 weeks Once we get off the Wellbutrin, we can try and wean the Citalopram by decreasing to 1/2 tab daily x3-4 weeks and then every other day x2 weeks If at any time we need to go back up, we can Call with any questions or concerns Stay Safe!  Stay Healthy! Enjoy the rest of your summer!!!

## 2022-05-31 ENCOUNTER — Ambulatory Visit (INDEPENDENT_AMBULATORY_CARE_PROVIDER_SITE_OTHER): Payer: Commercial Managed Care - PPO | Admitting: Family Medicine

## 2022-05-31 ENCOUNTER — Other Ambulatory Visit: Payer: Self-pay | Admitting: Family Medicine

## 2022-05-31 ENCOUNTER — Encounter: Payer: Self-pay | Admitting: Family Medicine

## 2022-05-31 VITALS — BP 106/70 | HR 97 | Temp 98.8°F | Resp 17 | Ht 66.0 in | Wt 159.5 lb

## 2022-05-31 DIAGNOSIS — K219 Gastro-esophageal reflux disease without esophagitis: Secondary | ICD-10-CM | POA: Insufficient documentation

## 2022-05-31 DIAGNOSIS — R21 Rash and other nonspecific skin eruption: Secondary | ICD-10-CM

## 2022-05-31 DIAGNOSIS — N939 Abnormal uterine and vaginal bleeding, unspecified: Secondary | ICD-10-CM | POA: Insufficient documentation

## 2022-05-31 MED ORDER — FLURANDRENOLIDE 0.05 % EX CREA: 1.0000 | TOPICAL_CREAM | Freq: Two times a day (BID) | CUTANEOUS | 0 refills | Status: DC | PRN

## 2022-05-31 NOTE — Patient Instructions (Signed)
Follow up as needed or as scheduled Start the cream twice daily until sxs improve Call with any questions or concerns Hang in there! Enjoy the rest of your summer!!!

## 2022-05-31 NOTE — Progress Notes (Signed)
   Subjective:    Patient ID: Rebecca Gallegos, female    DOB: 04-20-1966, 56 y.o.   MRN: [id number]  HPI Rash- sxs started ~10 days ago.  It is primarily along edge of nose, very itchy, flaking, raw, dry.  Called derm but they didn't have appt x2 weeks.  Pt has hx of similar.  Has used Cordran cream in the past w/ good results.   Review of Systems For ROS see HPI     Objective:   Physical Exam Vitals reviewed.  Constitutional:      General: She is not in acute distress.    Appearance: Normal appearance. She is not ill-appearing.  HENT:     Head: Normocephalic and atraumatic.  Skin:    General: Skin is warm and dry.     Findings: Rash (erythematous papules along nasal crease bilaterally) present.  Neurological:     Mental Status: She is alert.           Assessment & Plan:  Rash- new.  Pt reports she has a hx of this and it flares 'every few years'.  Will use Cordran cream as this is what derm has prescribed in the past.  Pt expressed understanding and is in agreement w/ plan.

## 2022-05-31 NOTE — Telephone Encounter (Signed)
Pharmacy requested alternative to prescription

## 2022-07-01 ENCOUNTER — Encounter: Payer: Self-pay | Admitting: Family Medicine

## 2022-07-01 DIAGNOSIS — F32A Depression, unspecified: Secondary | ICD-10-CM

## 2022-07-03 NOTE — Telephone Encounter (Signed)
Patient is requesting a refill of the following medications: Requested Prescriptions   Pending Prescriptions Disp Refills   ALPRAZolam (XANAX) 0.5 MG tablet 60 tablet 3    Sig: Take 1 tablet (0.5 mg total) by mouth 2 (two) times daily as needed. for anxiety    Date of patient request: 07/03/22 Last office visit: 05/31/22 Date of last refill: 01/15/22 Last refill amount: 60

## 2022-07-04 MED ORDER — ALPRAZOLAM 0.5 MG PO TABS: 0.5000 mg | ORAL_TABLET | Freq: Two times a day (BID) | ORAL | 3 refills | Status: DC | PRN

## 2022-07-06 ENCOUNTER — Ambulatory Visit (INDEPENDENT_AMBULATORY_CARE_PROVIDER_SITE_OTHER): Payer: Commercial Managed Care - PPO | Admitting: Family Medicine

## 2022-07-06 DIAGNOSIS — Z23 Encounter for immunization: Secondary | ICD-10-CM | POA: Diagnosis not present

## 2022-07-06 NOTE — Progress Notes (Signed)
Pt presented today for influenza vaccine, no concerns

## 2022-07-15 ENCOUNTER — Other Ambulatory Visit (HOSPITAL_BASED_OUTPATIENT_CLINIC_OR_DEPARTMENT_OTHER): Payer: Self-pay

## 2022-07-15 MED ORDER — WEGOVY 1 MG/0.5ML ~~LOC~~ SOAJ
SUBCUTANEOUS | 3 refills | Status: DC
  Filled 2022-07-15: qty 2, 28d supply, fill #0

## 2022-07-16 ENCOUNTER — Other Ambulatory Visit (HOSPITAL_BASED_OUTPATIENT_CLINIC_OR_DEPARTMENT_OTHER): Payer: Self-pay

## 2022-07-16 MED ORDER — SYNTHROID 125 MCG PO TABS
125.0000 ug | ORAL_TABLET | Freq: Every morning | ORAL | 1 refills | Status: DC
  Filled 2022-07-16: qty 30, 30d supply, fill #0

## 2022-07-26 ENCOUNTER — Other Ambulatory Visit (HOSPITAL_BASED_OUTPATIENT_CLINIC_OR_DEPARTMENT_OTHER): Payer: Self-pay

## 2022-07-26 MED ORDER — WEGOVY 1.7 MG/0.75ML ~~LOC~~ SOAJ
SUBCUTANEOUS | 1 refills | Status: DC
  Filled 2022-07-26: qty 3, 28d supply, fill #0

## 2022-08-08 ENCOUNTER — Other Ambulatory Visit: Payer: Self-pay | Admitting: Family Medicine

## 2022-08-09 ENCOUNTER — Other Ambulatory Visit (HOSPITAL_BASED_OUTPATIENT_CLINIC_OR_DEPARTMENT_OTHER): Payer: Self-pay

## 2022-08-09 MED ORDER — LEVOTHYROXINE SODIUM 125 MCG PO TABS
125.0000 ug | ORAL_TABLET | Freq: Every morning | ORAL | 1 refills | Status: DC
  Filled 2022-08-09: qty 30, 30d supply, fill #0

## 2022-08-11 ENCOUNTER — Other Ambulatory Visit (HOSPITAL_BASED_OUTPATIENT_CLINIC_OR_DEPARTMENT_OTHER): Payer: Self-pay

## 2022-08-16 ENCOUNTER — Other Ambulatory Visit (HOSPITAL_BASED_OUTPATIENT_CLINIC_OR_DEPARTMENT_OTHER): Payer: Self-pay

## 2022-08-16 MED ORDER — WEGOVY 2.4 MG/0.75ML ~~LOC~~ SOAJ
SUBCUTANEOUS | 3 refills | Status: DC
  Filled 2022-08-16: qty 2, 28d supply, fill #0
  Filled 2022-08-23: qty 3, 28d supply, fill #0
  Filled 2022-09-09 – 2022-09-14 (×2): qty 3, 28d supply, fill #1
  Filled 2022-10-12 – 2022-10-16 (×2): qty 3, 28d supply, fill #2
  Filled 2022-11-07 (×2): qty 3, 28d supply, fill #3

## 2022-08-17 ENCOUNTER — Other Ambulatory Visit (HOSPITAL_BASED_OUTPATIENT_CLINIC_OR_DEPARTMENT_OTHER): Payer: Self-pay

## 2022-08-23 ENCOUNTER — Other Ambulatory Visit (HOSPITAL_BASED_OUTPATIENT_CLINIC_OR_DEPARTMENT_OTHER): Payer: Self-pay

## 2022-08-24 ENCOUNTER — Other Ambulatory Visit (HOSPITAL_BASED_OUTPATIENT_CLINIC_OR_DEPARTMENT_OTHER): Payer: Self-pay

## 2022-09-05 ENCOUNTER — Ambulatory Visit: Payer: Commercial Managed Care - PPO | Admitting: Family Medicine

## 2022-09-05 ENCOUNTER — Encounter: Payer: Self-pay | Admitting: Family Medicine

## 2022-09-05 VITALS — BP 112/80 | HR 84 | Temp 97.6°F | Resp 18 | Ht 66.0 in | Wt 152.4 lb

## 2022-09-05 DIAGNOSIS — F32A Depression, unspecified: Secondary | ICD-10-CM | POA: Diagnosis not present

## 2022-09-05 DIAGNOSIS — F419 Anxiety disorder, unspecified: Secondary | ICD-10-CM

## 2022-09-05 NOTE — Patient Instructions (Signed)
Schedule after the holidays to recheck mood RESTART Citalopram- 1/2 tab daily If no improvement or worsening- let me know Call with any questions or concerns Hang in there!!! Happy Holidays!!!

## 2022-09-05 NOTE — Progress Notes (Signed)
   Subjective:    Patient ID: Rebecca Gallegos, female    DOB: 03-Jul-1966, 56 y.o.   MRN: [id number]  HPI Anxiety- this summer pt wanted to wean off Wellbutrin and Citalopram.  Pt reports she is 'not sad or melancholy, but my anxiety is through the roof'.  Pt reports racing thoughts, decreased sleep, feeling on edge.  Has Citalopram leftover.   Review of Systems For ROS see HPI     Objective:   Physical Exam Vitals reviewed.  Constitutional:      General: She is not in acute distress.    Appearance: Normal appearance. She is not ill-appearing.  HENT:     Head: Normocephalic and atraumatic.  Skin:    General: Skin is warm and dry.  Neurological:     General: No focal deficit present.     Mental Status: She is alert and oriented to person, place, and time.  Psychiatric:        Mood and Affect: Mood normal.        Behavior: Behavior normal.        Thought Content: Thought content normal.           Assessment & Plan:

## 2022-09-05 NOTE — Assessment & Plan Note (Signed)
Deteriorated.  Pt's anxiety is very high- parents are both 4, sister is dying, work is very stressful.  Doesn't feel depression is the issue.  Will restart Citalopram '20mg'$  daily and monitor closely.  Continue the Alprazolam as needed for panicked moments.

## 2022-09-09 ENCOUNTER — Other Ambulatory Visit (HOSPITAL_BASED_OUTPATIENT_CLINIC_OR_DEPARTMENT_OTHER): Payer: Self-pay

## 2022-09-10 ENCOUNTER — Other Ambulatory Visit (HOSPITAL_BASED_OUTPATIENT_CLINIC_OR_DEPARTMENT_OTHER): Payer: Self-pay

## 2022-09-10 ENCOUNTER — Encounter (HOSPITAL_BASED_OUTPATIENT_CLINIC_OR_DEPARTMENT_OTHER): Payer: Self-pay | Admitting: Pharmacist

## 2022-09-14 ENCOUNTER — Other Ambulatory Visit (HOSPITAL_BASED_OUTPATIENT_CLINIC_OR_DEPARTMENT_OTHER): Payer: Self-pay

## 2022-09-20 ENCOUNTER — Ambulatory Visit: Payer: Commercial Managed Care - PPO | Admitting: Family Medicine

## 2022-10-03 ENCOUNTER — Telehealth: Payer: Self-pay | Admitting: Family Medicine

## 2022-10-03 NOTE — Telephone Encounter (Signed)
Spoke to the pt and she states she thinks she needs to have her Xanax increased to take up to 4 times a day due to anxiety has become more frequent I advised her that she may need an apt to be seen

## 2022-10-03 NOTE — Telephone Encounter (Signed)
If pt's anxiety is that high, she needs to restart her Citalopram daily to get it under better control.  We can send in '20mg'$  daily, #30, 3 refills.  By taking that much Alprazolam each day, she runs the risk of becoming dependent/addicted.  That medication is to be used as needed and not regularly.  If needed regularly, then it's time to add daily controller medications

## 2022-10-03 NOTE — Telephone Encounter (Signed)
Caller name: Tekila Caillouet  On [place]?: Yes  Call back number: [phone] (mobile)  Provider they see: Midge Minium, MD  Reason for call:  Patient called stating that she currently taking her medication (xanax 0.5 mg) twice day. Patient wants to know if she can increase it to four times day.

## 2022-10-03 NOTE — Telephone Encounter (Signed)
Can restart the Wellbutrin XL '150mg'$  daily.  If she needs a prescription we can send #30, 3 refills

## 2022-10-03 NOTE — Telephone Encounter (Signed)
Spoke to the pt and she states she is taking 40 mg of Celexa daily and is wanting to know if she can add the Wellbutrin back in ?

## 2022-10-03 NOTE — Telephone Encounter (Signed)
Spoke to the pt and advised Dr Birdie Riddle agreed that she can start the Wellbutrin back and pt states she has enough of the medication

## 2022-10-11 ENCOUNTER — Other Ambulatory Visit: Payer: Self-pay | Admitting: Family Medicine

## 2022-10-11 DIAGNOSIS — Z1231 Encounter for screening mammogram for malignant neoplasm of breast: Secondary | ICD-10-CM

## 2022-10-16 ENCOUNTER — Other Ambulatory Visit (HOSPITAL_COMMUNITY): Payer: Self-pay

## 2022-10-16 ENCOUNTER — Other Ambulatory Visit (HOSPITAL_BASED_OUTPATIENT_CLINIC_OR_DEPARTMENT_OTHER): Payer: Self-pay

## 2022-10-25 ENCOUNTER — Encounter: Payer: Self-pay | Admitting: Family Medicine

## 2022-10-29 ENCOUNTER — Ambulatory Visit (INDEPENDENT_AMBULATORY_CARE_PROVIDER_SITE_OTHER): Payer: Commercial Managed Care - PPO | Admitting: Family Medicine

## 2022-10-29 ENCOUNTER — Other Ambulatory Visit: Payer: Self-pay

## 2022-10-29 ENCOUNTER — Encounter: Payer: Self-pay | Admitting: Family Medicine

## 2022-10-29 VITALS — BP 134/82 | HR 83 | Temp 97.2°F | Resp 16 | Ht 66.0 in | Wt 138.5 lb

## 2022-10-29 DIAGNOSIS — F419 Anxiety disorder, unspecified: Secondary | ICD-10-CM

## 2022-10-29 DIAGNOSIS — R55 Syncope and collapse: Secondary | ICD-10-CM

## 2022-10-29 DIAGNOSIS — F32A Depression, unspecified: Secondary | ICD-10-CM

## 2022-10-29 MED ORDER — ALPRAZOLAM 0.5 MG PO TABS: 0.5000 mg | ORAL_TABLET | Freq: Two times a day (BID) | ORAL | 3 refills | Status: DC | PRN

## 2022-10-29 MED ORDER — BUPROPION HCL ER (XL) 150 MG PO TB24: 150.0000 mg | ORAL_TABLET | Freq: Every day | ORAL | 1 refills | Status: DC

## 2022-10-29 MED ORDER — CITALOPRAM HYDROBROMIDE 40 MG PO TABS: 40.0000 mg | ORAL_TABLET | Freq: Every day | ORAL | 1 refills | Status: DC

## 2022-10-29 NOTE — Telephone Encounter (Signed)
Xanax 0.5 mg  LOV: 09/05/22 Last Refill:07/04/22 Upcoming appt: none

## 2022-10-29 NOTE — Patient Instructions (Addendum)
Follow up as needed or as scheduled We'll notify you of your lab results and make any changes if needed Make sure you are eating regularly throughout the day- goal is approximately every 3 hrs Continue to drink LOTS of water Change positions SLOWLY.  Allow yourself time to adjust Call with any questions or concerns Hang in there!!!

## 2022-10-29 NOTE — Progress Notes (Signed)
   Subjective:    Patient ID: Rebecca Gallegos, female    DOB: 11/18/1965, 57 y.o.   MRN: [id number]  HPI Pre-syncope- sxs started 2-3 weeks ago.  Last week woke up to use the restroom and when she was nearing the toilet her vision went black, hearing 'went out', nausea.  Typically occurs when standing.  Today occurred while sitting.  Drinks 2-3 cups of coffee each day and then drinks water throughout the day.  Pt is down 14 lbs since mid-November.  Only eating 1 small meal daily and drinking 1 boost.  Also on Wegovy.  Has not eaten since 7pm last night.   Review of Systems For ROS see HPI     Objective:   Physical Exam Vitals reviewed.  Constitutional:      General: She is not in acute distress.    Appearance: Normal appearance. She is well-developed. She is not ill-appearing.  HENT:     Head: Normocephalic and atraumatic.  Eyes:     Conjunctiva/sclera: Conjunctivae normal.     Pupils: Pupils are equal, round, and reactive to light.  Neck:     Thyroid: No thyromegaly.  Cardiovascular:     Rate and Rhythm: Normal rate and regular rhythm.     Pulses: Normal pulses.     Heart sounds: Normal heart sounds. No murmur heard. Pulmonary:     Effort: Pulmonary effort is normal. No respiratory distress.     Breath sounds: Normal breath sounds.  Abdominal:     General: There is no distension.     Palpations: Abdomen is soft.     Tenderness: There is no abdominal tenderness.  Musculoskeletal:     Cervical back: Normal range of motion and neck supple.  Lymphadenopathy:     Cervical: No cervical adenopathy.  Skin:    General: Skin is warm and dry.  Neurological:     General: No focal deficit present.     Mental Status: She is alert and oriented to person, place, and time.     Coordination: Coordination normal.     Gait: Gait normal.  Psychiatric:        Mood and Affect: Mood normal.        Behavior: Behavior normal.        Thought Content: Thought content normal.            Assessment & Plan:   Pre-syncope- new.  Upon review of sxs and discussion w/ pt and husband, pt's sxs are most likely due to hypoglycemia.  She is only eating 1 small meal daily and drinking 1 boost shake.  She is on [facility] for weight loss and has lost 40+ lbs overall and 15 just since mid November.  Discussed need for adequate caloric intake, increased water intake (due to diuretic effect of caffeine), and changing positions slowly to allow her BP time to equilibrate.  Encouraged her to to discuss w/ Endo as she likely needs to stop Wegovy- or at the very least, greatly reduce.  Check labs to r/o electrolyte disturbance.  Encouraged eating q3hrs.  Pt expressed understanding and is in agreement w/ plan.

## 2022-10-29 NOTE — Telephone Encounter (Signed)
Informed pt that Rx was sent in

## 2022-10-30 ENCOUNTER — Telehealth: Payer: Self-pay

## 2022-10-30 LAB — HEPATIC FUNCTION PANEL
ALT: 9 U/L (ref 0–35)
AST: 11 U/L (ref 0–37)
Albumin: 4.8 g/dL (ref 3.5–5.2)
Alkaline Phosphatase: 36 U/L — ABNORMAL LOW (ref 39–117)
Bilirubin, Direct: 0.1 mg/dL (ref 0.0–0.3)
Total Bilirubin: 0.7 mg/dL (ref 0.2–1.2)
Total Protein: 7.4 g/dL (ref 6.0–8.3)

## 2022-10-30 LAB — BASIC METABOLIC PANEL
BUN: 12 mg/dL (ref 6–23)
CO2: 29 mEq/L (ref 19–32)
Calcium: 9.7 mg/dL (ref 8.4–10.5)
Chloride: 101 mEq/L (ref 96–112)
Creatinine, Ser: 0.67 mg/dL (ref 0.40–1.20)
GFR: 96.78 mL/min (ref >60.00)
Glucose, Bld: 86 mg/dL (ref 70–99)
Potassium: 4.7 mEq/L (ref 3.5–5.1)
Sodium: 139 mEq/L (ref 135–145)

## 2022-10-30 LAB — CBC WITH DIFFERENTIAL/PLATELET
Basophils Absolute: 0.1 10*3/uL (ref 0.0–0.1)
Basophils Relative: 1.4 % (ref 0.0–3.0)
Eosinophils Absolute: 0.1 10*3/uL (ref 0.0–0.7)
Eosinophils Relative: 1.6 % (ref 0.0–5.0)
HCT: 43.5 % (ref 36.0–46.0)
Hemoglobin: 14.6 g/dL (ref 12.0–15.0)
Lymphocytes Relative: 32.2 % (ref 12.0–46.0)
Lymphs Abs: 1.7 10*3/uL (ref 0.7–4.0)
MCHC: 33.4 g/dL (ref 30.0–36.0)
MCV: 84.9 fl (ref 78.0–100.0)
Monocytes Absolute: 0.4 10*3/uL (ref 0.1–1.0)
Monocytes Relative: 7.3 % (ref 3.0–12.0)
Neutro Abs: 3 10*3/uL (ref 1.4–7.7)
Neutrophils Relative %: 57.5 % (ref 43.0–77.0)
Platelets: 238 10*3/uL (ref 150.0–400.0)
RBC: 5.13 Mil/uL — ABNORMAL HIGH (ref 3.87–5.11)
RDW: 14 % (ref 11.5–15.5)
WBC: 5.3 10*3/uL (ref 4.0–10.5)

## 2022-10-30 LAB — TSH: TSH: 0.27 u[IU]/mL — ABNORMAL LOW (ref 0.35–5.50)

## 2022-10-30 NOTE — Telephone Encounter (Signed)
-----   Message from Midge Minium, MD sent at 10/30/2022  4:00 PM EST ----- Labs look great w/ exception of low TSH.  This means your thyroid dose is too high and needs to be adjusted when you see Endocrinology next week

## 2022-10-30 NOTE — Telephone Encounter (Signed)
Left pt a VM stating lab results  

## 2022-11-06 ENCOUNTER — Other Ambulatory Visit: Payer: Self-pay | Admitting: Nurse Practitioner

## 2022-11-06 DIAGNOSIS — E890 Postprocedural hypothyroidism: Secondary | ICD-10-CM

## 2022-11-07 ENCOUNTER — Other Ambulatory Visit (HOSPITAL_BASED_OUTPATIENT_CLINIC_OR_DEPARTMENT_OTHER): Payer: Self-pay

## 2022-11-19 ENCOUNTER — Ambulatory Visit
Admission: RE | Admit: 2022-11-19 | Discharge: 2022-11-19 | Disposition: A | Discharge status: Home / Self Care | Payer: Commercial Managed Care - PPO | Source: Ambulatory Visit | Attending: Nurse Practitioner | Admitting: Nurse Practitioner

## 2022-11-19 DIAGNOSIS — E890 Postprocedural hypothyroidism: Secondary | ICD-10-CM

## 2022-11-30 ENCOUNTER — Encounter (HOSPITAL_BASED_OUTPATIENT_CLINIC_OR_DEPARTMENT_OTHER): Payer: Self-pay

## 2022-11-30 ENCOUNTER — Other Ambulatory Visit (HOSPITAL_BASED_OUTPATIENT_CLINIC_OR_DEPARTMENT_OTHER): Payer: Self-pay

## 2022-11-30 MED ORDER — WEGOVY 2.4 MG/0.75ML ~~LOC~~ SOAJ
2.4000 mg | SUBCUTANEOUS | 3 refills | Status: DC
  Filled 2022-11-30: qty 3, 28d supply, fill #0
  Filled 2023-01-09: qty 3, 28d supply, fill #1
  Filled 2023-02-01: qty 3, 28d supply, fill #2

## 2022-12-04 ENCOUNTER — Ambulatory Visit
Admission: RE | Admit: 2022-12-04 | Discharge: 2022-12-04 | Disposition: A | Discharge status: Home / Self Care | Payer: Commercial Managed Care - PPO | Source: Ambulatory Visit | Attending: Family Medicine | Admitting: Family Medicine

## 2022-12-04 DIAGNOSIS — Z1231 Encounter for screening mammogram for malignant neoplasm of breast: Secondary | ICD-10-CM

## 2022-12-06 ENCOUNTER — Other Ambulatory Visit (HOSPITAL_BASED_OUTPATIENT_CLINIC_OR_DEPARTMENT_OTHER): Payer: Self-pay

## 2022-12-06 ENCOUNTER — Other Ambulatory Visit: Payer: Self-pay

## 2022-12-06 ENCOUNTER — Telehealth: Payer: Self-pay | Admitting: Family Medicine

## 2022-12-06 NOTE — Telephone Encounter (Signed)
I did see the mammogram and [facility] contacts all patients directly to schedule their follow up if there is an abnormal reading.  They should reach out to her very soon.

## 2022-12-06 NOTE — Telephone Encounter (Signed)
Caller name: Jalanie Crank  On [place]?: Yes  Call back number: [phone] (mobile)  Provider they see: Midge Minium, MD  Reason for call: pt is calling in stating the she just rec'd her mammogram results and there were some abnormal findings and she would like to have more test done to rule out CN due to her just losing her sister to breast cancer 16 days ago.  Pt would like to have a call back to discuss the test that she is trying to have done.

## 2022-12-07 ENCOUNTER — Encounter: Payer: Self-pay | Admitting: Family Medicine

## 2022-12-07 ENCOUNTER — Other Ambulatory Visit: Payer: Self-pay | Admitting: Family Medicine

## 2022-12-07 DIAGNOSIS — R928 Other abnormal and inconclusive findings on diagnostic imaging of breast: Secondary | ICD-10-CM

## 2022-12-07 NOTE — Telephone Encounter (Signed)
Spoke to the pt and she has an apt for her mammogram Fev 22 ,2024 but pt wants to know if we mark it stat . She has also sent a my chart message I advised pt that Dr Randel Books is is currently seeing patients and will answer phone calls after [phone]

## 2022-12-07 NOTE — Telephone Encounter (Signed)
Left pt a detailed VM stating Dr Birdie Riddle above message

## 2022-12-07 NOTE — Telephone Encounter (Signed)
I responded to her message yesterday but it doesn't seem that anyone contacted her.  I did see the mammogram.  [facility] reaches out to any pt with an abnormal mammogram and gets them scheduled ASAP for follow up testing.  I signed off on these orders yesterday.  All abnormal mammos are treated as high priority.

## 2022-12-07 NOTE — Telephone Encounter (Signed)
Caller name: Rocelia Dagraca  On [place]?: Yes  Call back number: [phone] (mobile)  Provider they see: Midge Minium, MD  Reason for call: pt called in wanting to make sure Dr. Birdie Riddle saw the message from yesterday and she stated that she also sent in a mychart message this morning.  Pt would like to have a call back.

## 2022-12-10 ENCOUNTER — Other Ambulatory Visit (HOSPITAL_BASED_OUTPATIENT_CLINIC_OR_DEPARTMENT_OTHER): Payer: Self-pay

## 2022-12-11 ENCOUNTER — Encounter: Payer: Self-pay | Admitting: Family Medicine

## 2022-12-11 DIAGNOSIS — R490 Dysphonia: Secondary | ICD-10-CM

## 2022-12-12 NOTE — Telephone Encounter (Signed)
Okay to place referral for [facility]? Or should we have a visit

## 2022-12-12 NOTE — Telephone Encounter (Signed)
Pt is okay with [facility] [facility]

## 2022-12-12 NOTE — Telephone Encounter (Signed)
Referral has been sent to [facility] and pt is aware.

## 2022-12-14 ENCOUNTER — Ambulatory Visit
Admission: RE | Admit: 2022-12-14 | Discharge: 2022-12-14 | Disposition: A | Discharge status: Home / Self Care | Payer: Commercial Managed Care - PPO | Source: Ambulatory Visit | Attending: Family Medicine | Admitting: Family Medicine

## 2022-12-14 ENCOUNTER — Other Ambulatory Visit: Payer: Self-pay | Admitting: Family Medicine

## 2022-12-14 DIAGNOSIS — R928 Other abnormal and inconclusive findings on diagnostic imaging of breast: Secondary | ICD-10-CM

## 2022-12-17 ENCOUNTER — Encounter: Payer: Self-pay | Admitting: Family Medicine

## 2022-12-17 ENCOUNTER — Telehealth: Payer: Self-pay | Admitting: Genetic Counselor

## 2022-12-17 ENCOUNTER — Other Ambulatory Visit (HOSPITAL_BASED_OUTPATIENT_CLINIC_OR_DEPARTMENT_OTHER): Payer: Self-pay

## 2022-12-17 DIAGNOSIS — Z8041 Family history of malignant neoplasm of ovary: Secondary | ICD-10-CM

## 2022-12-17 DIAGNOSIS — Z803 Family history of malignant neoplasm of breast: Secondary | ICD-10-CM

## 2022-12-17 NOTE — Telephone Encounter (Signed)
Scheduled appt per 2/26 referral. Pt is aware of appt date and time. Pt is aware to arrive 15 mins prior to appt time and to bring and updated insurance card. Pt is aware of appt location.

## 2022-12-18 ENCOUNTER — Other Ambulatory Visit: Payer: Commercial Managed Care - PPO

## 2022-12-18 ENCOUNTER — Ambulatory Visit
Admission: RE | Admit: 2022-12-18 | Discharge: 2022-12-18 | Disposition: A | Discharge status: Home / Self Care | Payer: Commercial Managed Care - PPO | Source: Ambulatory Visit | Attending: Family Medicine | Admitting: Family Medicine

## 2022-12-18 ENCOUNTER — Other Ambulatory Visit: Payer: Self-pay | Admitting: Family Medicine

## 2022-12-18 DIAGNOSIS — R928 Other abnormal and inconclusive findings on diagnostic imaging of breast: Secondary | ICD-10-CM

## 2022-12-18 HISTORY — PX: BREAST BIOPSY: SHX20

## 2022-12-19 ENCOUNTER — Encounter: Payer: Self-pay | Admitting: Family Medicine

## 2022-12-20 ENCOUNTER — Other Ambulatory Visit (HOSPITAL_BASED_OUTPATIENT_CLINIC_OR_DEPARTMENT_OTHER): Payer: Self-pay

## 2022-12-20 MED ORDER — LEVOTHYROXINE SODIUM 125 MCG PO TABS
125.0000 ug | ORAL_TABLET | Freq: Every morning | ORAL | 1 refills | Status: DC
  Filled 2022-12-20: qty 30, 30d supply, fill #0
  Filled 2023-02-06: qty 30, 30d supply, fill #1
  Filled 2023-03-21: qty 30, 30d supply, fill #2
  Filled 2023-04-14: qty 30, 30d supply, fill #3

## 2022-12-21 ENCOUNTER — Encounter: Payer: Self-pay | Admitting: Family Medicine

## 2022-12-25 NOTE — Telephone Encounter (Signed)
Sent  an email to the referral coordinator

## 2022-12-26 NOTE — Telephone Encounter (Signed)
I was able to get the pt in at [facility] on 12/31/22. She is aware of this and Thanks [place] for helping with getting her scheduled.

## 2023-01-07 ENCOUNTER — Encounter: Payer: Commercial Managed Care - PPO | Admitting: Family Medicine

## 2023-01-11 ENCOUNTER — Ambulatory Visit (INDEPENDENT_AMBULATORY_CARE_PROVIDER_SITE_OTHER): Payer: Commercial Managed Care - PPO | Admitting: Family Medicine

## 2023-01-11 ENCOUNTER — Encounter: Payer: Self-pay | Admitting: Family Medicine

## 2023-01-11 VITALS — BP 116/72 | HR 89 | Temp 98.9°F | Resp 17 | Ht 66.0 in | Wt 134.5 lb

## 2023-01-11 DIAGNOSIS — Z0000 Encounter for general adult medical examination without abnormal findings: Secondary | ICD-10-CM | POA: Diagnosis not present

## 2023-01-11 DIAGNOSIS — E7800 Pure hypercholesterolemia, unspecified: Secondary | ICD-10-CM | POA: Diagnosis not present

## 2023-01-11 LAB — CBC WITH DIFFERENTIAL/PLATELET
Basophils Absolute: 0.1 10*3/uL (ref 0.0–0.1)
Basophils Relative: 1.1 % (ref 0.0–3.0)
Eosinophils Absolute: 0.1 10*3/uL (ref 0.0–0.7)
Eosinophils Relative: 2.8 % (ref 0.0–5.0)
HCT: 40 % (ref 36.0–46.0)
Hemoglobin: 13.4 g/dL (ref 12.0–15.0)
Lymphocytes Relative: 36.5 % (ref 12.0–46.0)
Lymphs Abs: 1.9 10*3/uL (ref 0.7–4.0)
MCHC: 33.4 g/dL (ref 30.0–36.0)
MCV: 87.2 fl (ref 78.0–100.0)
Monocytes Absolute: 0.3 10*3/uL (ref 0.1–1.0)
Monocytes Relative: 6.8 % (ref 3.0–12.0)
Neutro Abs: 2.7 10*3/uL (ref 1.4–7.7)
Neutrophils Relative %: 52.8 % (ref 43.0–77.0)
Platelets: 228 10*3/uL (ref 150.0–400.0)
RBC: 4.59 Mil/uL (ref 3.87–5.11)
RDW: 14.1 % (ref 11.5–15.5)
WBC: 5.1 10*3/uL (ref 4.0–10.5)

## 2023-01-11 LAB — HEPATIC FUNCTION PANEL
ALT: 12 U/L (ref 0–35)
AST: 13 U/L (ref 0–37)
Albumin: 4.5 g/dL (ref 3.5–5.2)
Alkaline Phosphatase: 40 U/L (ref 39–117)
Bilirubin, Direct: 0.1 mg/dL (ref 0.0–0.3)
Total Bilirubin: 0.5 mg/dL (ref 0.2–1.2)
Total Protein: 7 g/dL (ref 6.0–8.3)

## 2023-01-11 LAB — LIPID PANEL
Cholesterol: 184 mg/dL (ref 0–200)
HDL: 68.5 mg/dL (ref >39.00)
LDL Cholesterol: 103 mg/dL — ABNORMAL HIGH (ref 0–99)
NonHDL: 115.44
Total CHOL/HDL Ratio: 3
Triglycerides: 63 mg/dL (ref 0.0–149.0)
VLDL: 12.6 mg/dL (ref 0.0–40.0)

## 2023-01-11 LAB — BASIC METABOLIC PANEL
BUN: 17 mg/dL (ref 6–23)
CO2: 31 mEq/L (ref 19–32)
Calcium: 9.6 mg/dL (ref 8.4–10.5)
Chloride: 104 mEq/L (ref 96–112)
Creatinine, Ser: 0.77 mg/dL (ref 0.40–1.20)
GFR: 85.29 mL/min (ref >60.00)
Glucose, Bld: 81 mg/dL (ref 70–99)
Potassium: 4.8 mEq/L (ref 3.5–5.1)
Sodium: 142 mEq/L (ref 135–145)

## 2023-01-11 NOTE — Assessment & Plan Note (Signed)
Attempting to control w/ diet and exercise.  Check labs and determine if meds are needed ?

## 2023-01-11 NOTE — Patient Instructions (Addendum)
Follow up in 1 year or as needed We'll notify you of your lab results and make any changes if needed You look fabulous!!!  Don't lose any more weight!! Call with any questions or concerns Stay Safe!  Stay Healthy!! Happy Early Rudene Anda!! GOOD LUCK!!

## 2023-01-11 NOTE — Assessment & Plan Note (Signed)
Pt's PE WNL.  UTD on mammo, colonoscopy, Tdap, flu.  No need for pap due to hysterectomy.  Check labs.  Anticipatory guidance provided.

## 2023-01-11 NOTE — Progress Notes (Signed)
   Subjective:    Patient ID: Rebecca Gallegos, female    DOB: 1966-06-03, 57 y.o.   MRN: [id number]  HPI CPE- UTD on mammo, colonoscopy, Tdap, flu  Patient Care Team    Relationship Specialty Notifications Start End  Midge Minium, MD PCP - General Family Medicine  03/12/16   Jacelyn Pi, MD Consulting Physician Endocrinology  07/31/17     Health Maintenance  Topic Date Due   MAMMOGRAM  12/05/2023   COLONOSCOPY (Pts 45-20yrs Insurance coverage will need to be confirmed)  02/01/2025   DTaP/Tdap/Td (3 - Td or Tdap) 01/04/2032   INFLUENZA VACCINE  Completed   Hepatitis C Screening  Completed   HIV Screening  Completed   Zoster Vaccines- Shingrix  Completed   HPV VACCINES  Aged Out   COVID-19 Vaccine  Discontinued      Review of Systems Patient reports no vision/ hearing changes, adenopathy,fever, persistant/recurrent hoarseness , swallowing issues, chest pain, palpitations, edema, persistant/recurrent cough, hemoptysis, dyspnea (rest/exertional/paroxysmal nocturnal), gastrointestinal bleeding (melena, rectal bleeding), abdominal pain, significant heartburn, bowel changes, GU symptoms (dysuria, hematuria, incontinence), Gyn symptoms (abnormal  bleeding, pain),  syncope, focal weakness, memory loss, numbness & tingling, skin/hair/nail changes, abnormal bruising or bleeding, anxiety, or depression.   + 5 lb weight loss    Objective:   Physical Exam General Appearance:    Alert, cooperative, no distress, appears stated age  Head:    Normocephalic, without obvious abnormality, atraumatic  Eyes:    PERRL, conjunctiva/corneas clear, EOM's intact both eyes  Ears:    Normal TM's and external ear canals, both ears  Nose:   Nares normal, septum midline, mucosa normal, no drainage    or sinus tenderness  Throat:   Lips, mucosa, and tongue normal; teeth and gums normal  Neck:   Supple, symmetrical, trachea midline, no adenopathy;    Thyroid: no enlargement/tenderness/nodules   Back:     Symmetric, no curvature, ROM normal, no CVA tenderness  Lungs:     Clear to auscultation bilaterally, respirations unlabored  Chest Wall:    No tenderness or deformity   Heart:    Regular rate and rhythm, S1 and S2 normal, no murmur, rub   or gallop  Breast Exam:    Deferred to GYN  Abdomen:     Soft, non-tender, bowel sounds active all four quadrants,    no masses, no organomegaly  Genitalia:    Deferred to GYN  Rectal:    Extremities:   Extremities normal, atraumatic, no cyanosis or edema  Pulses:   2+ and symmetric all extremities  Skin:   Skin color, texture, turgor normal, no rashes or lesions  Lymph nodes:   Cervical, supraclavicular, and axillary nodes normal  Neurologic:   CNII-XII intact, normal strength, sensation and reflexes    throughout          Assessment & Plan:

## 2023-01-16 ENCOUNTER — Encounter: Payer: Self-pay | Admitting: Family Medicine

## 2023-01-22 ENCOUNTER — Telehealth: Payer: Self-pay

## 2023-01-22 NOTE — Telephone Encounter (Signed)
-----   Message from Midge Minium, MD sent at 01/21/2023  8:16 PM EDT ----- Labs look great!  No changes at this time

## 2023-01-22 NOTE — Telephone Encounter (Signed)
Informed pt of lab results  

## 2023-01-28 ENCOUNTER — Other Ambulatory Visit: Payer: Self-pay | Admitting: Family Medicine

## 2023-02-05 ENCOUNTER — Other Ambulatory Visit (HOSPITAL_BASED_OUTPATIENT_CLINIC_OR_DEPARTMENT_OTHER): Payer: Self-pay

## 2023-02-05 ENCOUNTER — Other Ambulatory Visit: Payer: Commercial Managed Care - PPO

## 2023-02-05 ENCOUNTER — Encounter: Payer: Commercial Managed Care - PPO | Admitting: Genetic Counselor

## 2023-02-05 MED ORDER — WEGOVY 1.7 MG/0.75ML ~~LOC~~ SOAJ
1.7000 mg | SUBCUTANEOUS | 3 refills | Status: DC
  Filled 2023-02-05: qty 3, 28d supply, fill #0
  Filled 2023-03-05: qty 3, 28d supply, fill #1
  Filled 2023-03-28: qty 3, 28d supply, fill #2
  Filled 2023-04-27 (×2): qty 3, 28d supply, fill #3

## 2023-02-27 ENCOUNTER — Other Ambulatory Visit: Payer: Self-pay

## 2023-02-27 DIAGNOSIS — F32A Depression, unspecified: Secondary | ICD-10-CM

## 2023-02-27 MED ORDER — ALPRAZOLAM 0.5 MG PO TABS: 0.5000 mg | ORAL_TABLET | Freq: Two times a day (BID) | ORAL | 3 refills | Status: DC | PRN

## 2023-02-27 NOTE — Telephone Encounter (Signed)
Patient is requesting a refill of the following medications: Requested Prescriptions   Pending Prescriptions Disp Refills   ALPRAZolam (XANAX) 0.5 MG tablet 60 tablet 3    Sig: Take 1 tablet (0.5 mg total) by mouth 2 (two) times daily as needed. for anxiety    Date of patient request: 02/27/2023 Last office visit: 01/11/23 Date of last refill: 10/29/2022 Last refill amount: 60x3 Follow up time period per chart: 1 year

## 2023-03-04 ENCOUNTER — Other Ambulatory Visit (HOSPITAL_BASED_OUTPATIENT_CLINIC_OR_DEPARTMENT_OTHER): Payer: Self-pay

## 2023-03-04 MED ORDER — SPIRONOLACTONE 50 MG PO TABS
50.0000 mg | ORAL_TABLET | Freq: Two times a day (BID) | ORAL | 3 refills | Status: DC
  Filled 2023-03-04: qty 60, 30d supply, fill #0

## 2023-03-23 ENCOUNTER — Other Ambulatory Visit: Payer: Self-pay | Admitting: Family Medicine

## 2023-04-27 ENCOUNTER — Other Ambulatory Visit (HOSPITAL_BASED_OUTPATIENT_CLINIC_OR_DEPARTMENT_OTHER): Payer: Self-pay

## 2023-04-28 ENCOUNTER — Other Ambulatory Visit (HOSPITAL_BASED_OUTPATIENT_CLINIC_OR_DEPARTMENT_OTHER): Payer: Self-pay

## 2023-04-29 ENCOUNTER — Encounter: Payer: Self-pay | Admitting: Family Medicine

## 2023-05-09 ENCOUNTER — Ambulatory Visit: Payer: Commercial Managed Care - PPO | Admitting: Family Medicine

## 2023-05-14 ENCOUNTER — Other Ambulatory Visit (HOSPITAL_BASED_OUTPATIENT_CLINIC_OR_DEPARTMENT_OTHER): Payer: Self-pay

## 2023-05-14 MED ORDER — WEGOVY 1.7 MG/0.75ML ~~LOC~~ SOAJ
1.7000 mg | SUBCUTANEOUS | 5 refills | Status: DC
  Filled 2023-05-14 – 2023-05-21 (×2): qty 3, 28d supply, fill #0
  Filled 2023-06-12: qty 3, 28d supply, fill #1
  Filled 2023-07-03: qty 3, 28d supply, fill #2
  Filled 2023-07-26: qty 3, 28d supply, fill #3
  Filled 2023-08-21: qty 3, 28d supply, fill #4
  Filled 2023-09-16: qty 3, 28d supply, fill #5

## 2023-05-17 ENCOUNTER — Telehealth: Payer: Self-pay | Admitting: Family Medicine

## 2023-05-21 ENCOUNTER — Other Ambulatory Visit (HOSPITAL_BASED_OUTPATIENT_CLINIC_OR_DEPARTMENT_OTHER): Payer: Self-pay

## 2023-05-27 ENCOUNTER — Other Ambulatory Visit: Payer: Self-pay | Admitting: Family Medicine

## 2023-05-27 ENCOUNTER — Encounter: Payer: Self-pay | Admitting: Neurology

## 2023-05-27 ENCOUNTER — Ambulatory Visit: Payer: Commercial Managed Care - PPO | Admitting: Neurology

## 2023-05-27 VITALS — BP 118/66 | HR 69 | Ht 66.0 in | Wt 131.5 lb

## 2023-05-27 DIAGNOSIS — H47333 Pseudopapilledema of optic disc, bilateral: Secondary | ICD-10-CM

## 2023-05-27 NOTE — Patient Instructions (Addendum)
MRI Brain to rule out causes of Papilledema  If MRI Brain negative, patient will follow up with ophthalmology to evaluation of pseudopapilledema I will contact you to go over the results  Return as needed

## 2023-05-27 NOTE — Progress Notes (Signed)
[facility]  PATIENT: Rebecca Gallegos DOB: July 02, 1966  REQUESTING CLINICIAN: [facility] [facility]* HISTORY FROM: Patient  REASON FOR VISIT: Refer from Ophthalmology for pseudopapilledema    HISTORICAL  CHIEF COMPLAINT:  Chief Complaint  Patient presents with   New Patient (Initial Visit)    Rm12, alone OU pseudopapilledema, eye pressure:pt stated that she has floaters, eye pressure, has to blink to see (ongoing for 90 days) intermittent worse at times    HISTORY OF PRESENT ILLNESS:  This is a 57 year old woman past medical history of thyroid disease, family history of ovarian cancer who is presenting after ophthalmology examination found pseudopapilledema.  Patient reports going to ophthalmology because she was having some dry eyes and floaters.  Due to pseudopapilledema she was referred to neurology.  She denies any headaches, denies any vision loss, denies any blurry vision or double vision.  Denies any headaches.  She reports being on Wegovy for weight loss for the past year and a half and her doctors were concerned because Reginal Lutes is associated in rare cases to anterior ischemic optic neuropathy, again she denies any vision loss.  No neurological complaint, no weakness, no numbness.     OTHER MEDICAL CONDITIONS: Thyroid disease    REVIEW OF SYSTEMS: Full 14 system review of systems performed and negative with exception of: As noted in the HPI   ALLERGIES: Allergies  Allergen Reactions   Benzoin Dermatitis    Blisters, itching, redness, hot sensation with combo of Benzoin and steri strips   Iodine Solution [Povidone Iodine]     rash   Oxycodone Itching   Codeine Rash   Iodine Rash   Mouthwashes Other (See Comments)    [facility] mouthwash irritated mouth.    HOME MEDICATIONS: Outpatient Medications Prior to Visit  Medication Sig Dispense Refill   ALPRAZolam (XANAX) 0.5 MG tablet Take 1 tablet (0.5 mg total) by mouth 2 (two) times daily as  needed. for anxiety 60 tablet 3   buPROPion (WELLBUTRIN XL) 150 MG 24 hr tablet TAKE 1 TABLET DAILY 90 tablet 1   citalopram (CELEXA) 40 MG tablet TAKE 1 TABLET DAILY 90 tablet 1   cyanocobalamin 1000 MCG tablet Take 1,000 mcg by mouth daily.     fluticasone (FLONASE) 50 MCG/ACT nasal spray Place 2 sprays into both nostrils daily.     ibuprofen (ADVIL) 200 MG tablet Take 400 mg by mouth every 6 (six) hours as needed.     levothyroxine (SYNTHROID) 112 MCG tablet Take 112 mcg by mouth daily before breakfast.     Multiple Vitamin (MULTIVITAMIN) capsule Take 1 capsule by mouth daily.     Semaglutide-Weight Management (WEGOVY) 1.7 MG/0.75ML SOAJ Inject 1.7 mg into the skin once a week. 3 mL 5   levothyroxine (SYNTHROID) 125 MCG tablet Take 1 tablet (125 mcg total) by mouth every morning on an empty stomach. 90 tablet 1   Semaglutide-Weight Management (WEGOVY) 2.4 MG/0.75ML SOAJ Inject 2.4 mg subcutaneously once a week for 4 weeks 3 mL 3   spironolactone (ALDACTONE) 50 MG tablet Take 1 tablet (50 mg total) by mouth 2 (two) times daily. 60 tablet 3   No facility-administered medications prior to visit.    PAST MEDICAL HISTORY: Past Medical History:  Diagnosis Date   Anemia    Anxiety    hx- has gone away since thyroid level controlled   Family history of ovarian cancer 04/06/2015   Female pelvic pain 04/06/2015   Hematometra 05/05/2015   Hypothyroidism  S/P BSO (bilateral salpingo-oophorectomy) 05/06/2015   S/P endometrial ablation 04/06/2015   S/P laparoscopic assisted vaginal hysterectomy (LAVH) 05/06/2015   Wears glasses     PAST SURGICAL HISTORY: Past Surgical History:  Procedure Laterality Date   BACK SURGERY  10/23/2007   BREAST BIOPSY Right 12/18/2022   [place] RT BREAST BX W LOC DEV 1ST LESION IMG BX SPEC US GUIDE 12/18/2022 [facility]   BREAST BIOPSY Right 12/18/2022   MM RT BREAST BX W LOC DEV 1ST LESION IMAGE BX SPEC STEREO GUIDE 12/18/2022 [facility]   BRONCHIAL  WASHINGS  07/04/2020   Procedure: BRONCHIAL WASHINGS;  Surgeon: Leslye Peer, MD;  Location: [facility];  Service: Cardiopulmonary;;   CERVICAL SPINE SURGERY     LAPAROSCOPIC ASSISTED VAGINAL HYSTERECTOMY N/A 05/06/2015   Procedure: LAPAROSCOPIC ASSISTED VAGINAL HYSTERECTOMY;  Surgeon: Sherian Rein, MD;  Location: [facility];  Service: Gynecology;  Laterality: N/A;   SALPINGOOPHORECTOMY Bilateral 05/06/2015   Procedure: SALPINGO OOPHORECTOMY;  Surgeon: Sherian Rein, MD;  Location: [facility];  Service: Gynecology;  Laterality: Bilateral;   THYROIDECTOMY     TOTAL VAGINAL HYSTERECTOMY     TUBAL LIGATION  10/22/1989   bilateral   VIDEO BRONCHOSCOPY N/A 07/04/2020   Procedure: VIDEO BRONCHOSCOPY WITHOUT FLUORO;  Surgeon: Leslye Peer, MD;  Location: [facility];  Service: Cardiopulmonary;  Laterality: N/A;    FAMILY HISTORY: Family History  Problem Relation Age of Onset   Ovarian cancer Mother    Mental illness Mother    Diabetes Mother    Breast cancer Mother 108   Ovarian cancer Sister    Cancer Sister 45       ovarian   Breast cancer Sister    Cancer Maternal Aunt 35       blood cancer; unknown type   Breast cancer Maternal Aunt    Cancer Maternal Aunt    Cancer Maternal Grandmother 34       breast   Breast cancer Maternal Grandmother 92   Heart disease Maternal Grandfather    Cancer Maternal Grandfather 7       colon   Esophageal cancer Neg Hx    Rectal cancer Neg Hx    Stomach cancer Neg Hx     SOCIAL HISTORY: Social History   Socioeconomic History   Marital status: Married    Spouse name: Not on file   Number of children: 2   Years of education: Not on file   Highest education level: 12th grade  Occupational History   Not on file  Tobacco Use   Smoking status: Former    Current packs/day: 0.00    Average packs/day: 1 pack/day for 7.0 years (7.0 ttl pk-yrs)    Types: Cigarettes    Start date: 10/22/1989    Quit date: 10/22/1996    Years since  quitting: 26.6   Smokeless tobacco: Never  Vaping Use   Vaping status: Never Used  Substance and Sexual Activity   Alcohol use: Not Currently    Alcohol/week: 5.0 standard drinks of alcohol    Types: 5 Standard drinks or equivalent per week    Comment: daily- wine   Drug use: No   Sexual activity: Not Currently  Other Topics Concern   Not on file  Social History Narrative   Not on file   Social Determinants of Health   Financial Resource Strain: Not on file  Food Insecurity: Unknown (12/31/2022)   [facility]   Food vital sign  Within the past 12 months, you worried that your food would run out before you got money to buy more: Patient unable to answer    Within the past 12 months, the food you bought just didn't last and you didn't have money to get more: Not on file  Transportation Needs: No Transportation Needs (12/31/2022)   [facility]   Transportation    In the past 12 months, has lack of reliable transportation kept you from medical appointments, meetings, work or from getting things needed for daily living? : No  Physical Activity: Not on file  Stress: Not on file  Social Connections: Not on file  Intimate Partner Violence: Not on file    PHYSICAL EXAM   GENERAL EXAM/CONSTITUTIONAL: Vitals:  Vitals:   05/27/23 1100  BP: 118/66  Pulse: 69  Weight: 131 lb 8 oz (59.6 kg)  Height: 5\' 6"  (1.676 m)   Body mass index is 21.22 kg/m. Wt Readings from Last 3 Encounters:  05/27/23 131 lb 8 oz (59.6 kg)  01/11/23 134 lb 8 oz (61 kg)  10/29/22 138 lb 8 oz (62.8 kg)   Patient is in no distress; well developed, nourished and groomed; neck is supple  MUSCULOSKELETAL: Gait, strength, tone, movements noted in Neurologic exam below  NEUROLOGIC: MENTAL STATUS:      No data to display         awake, alert, oriented to person, place and time recent and remote memory intact normal attention and  concentration language fluent, comprehension intact, naming intact fund of knowledge appropriate  CRANIAL NERVE:  2nd, 3rd, 4th, 6th - Unable to assess optic disc on funduscopy Visual fields full to confrontation, extraocular muscles intact, no nystagmus 5th - facial sensation symmetric 7th - facial strength symmetric 8th - hearing intact 9th - palate elevates symmetrically, uvula midline 11th - shoulder shrug symmetric 12th - tongue protrusion midline  MOTOR:  normal bulk and tone, full strength in the BUE, BLE  SENSORY:  normal and symmetric to light touch  COORDINATION:  finger-nose-finger, fine finger movements normal  REFLEXES:  deep tendon reflexes present and symmetric  GAIT/STATION:  normal  DIAGNOSTIC DATA (LABS, IMAGING, TESTING) - I reviewed patient records, labs, notes, testing and imaging myself where available.  Lab Results  Component Value Date   WBC 5.1 01/11/2023   HGB 13.4 01/11/2023   HCT 40.0 01/11/2023   MCV 87.2 01/11/2023   PLT 228.0 01/11/2023      Component Value Date/Time   NA 142 01/11/2023 1403   NA 140 10/30/2019 0000   K 4.8 01/11/2023 1403   CL 104 01/11/2023 1403   CO2 31 01/11/2023 1403   GLUCOSE 81 01/11/2023 1403   BUN 17 01/11/2023 1403   BUN 21 10/30/2019 0000   CREATININE 0.77 01/11/2023 1403   CALCIUM 9.6 01/11/2023 1403   PROT 7.0 01/11/2023 1403   ALBUMIN 4.5 01/11/2023 1403   AST 13 01/11/2023 1403   ALT 12 01/11/2023 1403   ALKPHOS 40 01/11/2023 1403   BILITOT 0.5 01/11/2023 1403   GFRNONAA >60 05/07/2015 0516   GFRAA >60 05/07/2015 0516   Lab Results  Component Value Date   CHOL 184 01/11/2023   HDL 68.50 01/11/2023   LDLCALC 103 (H) 01/11/2023   TRIG 63.0 01/11/2023   CHOLHDL 3 01/11/2023   Lab Results  Component Value Date   HGBA1C 5.5 10/30/2019   No results found for: "VITAMINB12" Lab Results  Component Value Date  TSH 0.27 (L) 10/29/2022     ASSESSMENT AND PLAN  57 y.o. year old female  with thyroid disease, family history of ovarian cancer who is presenting for evaluation of pseudopapilledema.  She denies any headaches, denies any change of vision, loss of vision or double vision.  Fundus exam attempted but I could not clearly see the optic disc as patient's eyes were not dilated.  Plan for now is to obtain MRI brain to rule out to causes of true papilledema.  In the case that the MRI is negative and patient has pseudopapilledema, she will need to follow-up with ophthalmology for further evaluation of the cause of the pseudopapilledema.   In terms of the [facility] and the risk of anterior ischemic optic neuropathy, patient has no vision loss, no painful ophthalmoplegia, so my concern for AION is actually very low. I would suggest that we hold the medication for now, until our workup is done and at that time, we can decide to resume medication.    1. Pseudopapilledema of both optic discs     Patient Instructions  MRI Brain to rule out causes of Papilledema  If MRI Brain negative, patient will follow up with ophthalmology to evaluation of pseudopapilledema I will contact you to go over the results  Return as needed   Orders Placed This Encounter  Procedures   MR BRAIN W WO CONTRAST    No orders of the defined types were placed in this encounter.   Return if symptoms worsen or fail to improve.    Windell Norfolk, MD 05/27/2023, 2:52 PM  [facility] [address], [address] [place], [place] [address] [phone]

## 2023-05-29 ENCOUNTER — Encounter: Payer: Self-pay | Admitting: Neurology

## 2023-05-29 ENCOUNTER — Ambulatory Visit: Payer: Commercial Managed Care - PPO

## 2023-05-29 DIAGNOSIS — H47333 Pseudopapilledema of optic disc, bilateral: Secondary | ICD-10-CM

## 2023-05-29 MED ORDER — GADOBENATE DIMEGLUMINE 529 MG/ML IV SOLN
10.0000 mL | Freq: Once | INTRAVENOUS | Status: AC | PRN
  Administered 2023-05-29: 10 mL via INTRAVENOUS

## 2023-05-30 ENCOUNTER — Encounter: Payer: Self-pay | Admitting: Family Medicine

## 2023-06-19 ENCOUNTER — Other Ambulatory Visit: Payer: Self-pay

## 2023-06-19 ENCOUNTER — Other Ambulatory Visit (HOSPITAL_BASED_OUTPATIENT_CLINIC_OR_DEPARTMENT_OTHER): Payer: Self-pay

## 2023-06-19 MED ORDER — NYSTATIN 100000 UNIT/GM EX OINT
TOPICAL_OINTMENT | CUTANEOUS | 1 refills | Status: DC
  Filled 2023-06-19: qty 15, 7d supply, fill #0

## 2023-06-20 ENCOUNTER — Other Ambulatory Visit: Payer: Self-pay

## 2023-06-20 DIAGNOSIS — F419 Anxiety disorder, unspecified: Secondary | ICD-10-CM

## 2023-06-20 MED ORDER — ALPRAZOLAM 0.5 MG PO TABS: 0.5000 mg | ORAL_TABLET | Freq: Two times a day (BID) | ORAL | 3 refills | Status: DC | PRN

## 2023-06-20 NOTE — Telephone Encounter (Signed)
Xanax 0.5 mg Requested Prescriptions    No prescriptions requested or ordered in this encounter     Date of patient request: 06/20/23 Last office visit: 01/11/23 Date of last refill: 02/27/23 Last refill amount: 60 Follow up time period per chart: 1 year

## 2023-07-17 ENCOUNTER — Ambulatory Visit (INDEPENDENT_AMBULATORY_CARE_PROVIDER_SITE_OTHER): Payer: Commercial Managed Care - PPO

## 2023-07-17 ENCOUNTER — Ambulatory Visit: Payer: Commercial Managed Care - PPO

## 2023-07-17 DIAGNOSIS — Z23 Encounter for immunization: Secondary | ICD-10-CM | POA: Diagnosis not present

## 2023-07-17 NOTE — Progress Notes (Signed)
Per orders of Dr. Neena Rhymes, injection of  Flu vaccine  given by Dorris Fetch in right deltoid. Patient tolerated injection well.

## 2023-08-22 ENCOUNTER — Other Ambulatory Visit (HOSPITAL_BASED_OUTPATIENT_CLINIC_OR_DEPARTMENT_OTHER): Payer: Self-pay

## 2023-08-26 ENCOUNTER — Ambulatory Visit: Payer: Commercial Managed Care - PPO | Admitting: Podiatry

## 2023-09-16 ENCOUNTER — Other Ambulatory Visit (HOSPITAL_BASED_OUTPATIENT_CLINIC_OR_DEPARTMENT_OTHER): Payer: Self-pay

## 2023-09-23 ENCOUNTER — Other Ambulatory Visit (HOSPITAL_BASED_OUTPATIENT_CLINIC_OR_DEPARTMENT_OTHER): Payer: Self-pay

## 2023-09-23 MED ORDER — WEGOVY 1.7 MG/0.75ML ~~LOC~~ SOAJ
1.7000 mg | SUBCUTANEOUS | 5 refills | Status: DC
  Filled 2023-10-09: qty 3, 28d supply, fill #0
  Filled 2023-10-31: qty 3, 28d supply, fill #1
  Filled 2023-11-25: qty 3, 28d supply, fill #2
  Filled 2023-12-23: qty 3, 28d supply, fill #3
  Filled 2024-01-23: qty 3, 28d supply, fill #4
  Filled 2024-02-20: qty 3, 28d supply, fill #5

## 2023-09-24 ENCOUNTER — Encounter: Payer: Self-pay | Admitting: Family Medicine

## 2023-09-25 ENCOUNTER — Telehealth: Payer: Self-pay

## 2023-09-25 DIAGNOSIS — F32A Depression, unspecified: Secondary | ICD-10-CM

## 2023-09-25 MED ORDER — CITALOPRAM HYDROBROMIDE 40 MG PO TABS: 40.0000 mg | ORAL_TABLET | Freq: Every day | ORAL | 1 refills | Status: DC

## 2023-09-25 MED ORDER — ALPRAZOLAM 0.5 MG PO TABS: 0.5000 mg | ORAL_TABLET | Freq: Two times a day (BID) | ORAL | 3 refills | Status: DC | PRN

## 2023-09-25 NOTE — Telephone Encounter (Signed)
Sent to PCP ?

## 2023-09-25 NOTE — Addendum Note (Signed)
Addended by: Sheliah Hatch on: 09/25/2023 04:12 PM   Modules accepted: Orders

## 2023-09-25 NOTE — Telephone Encounter (Signed)
Prescription sent to mail order pharmacy 

## 2023-10-09 ENCOUNTER — Other Ambulatory Visit (HOSPITAL_BASED_OUTPATIENT_CLINIC_OR_DEPARTMENT_OTHER): Payer: Self-pay

## 2023-10-10 ENCOUNTER — Other Ambulatory Visit (HOSPITAL_BASED_OUTPATIENT_CLINIC_OR_DEPARTMENT_OTHER): Payer: Self-pay

## 2023-10-18 NOTE — Telephone Encounter (Signed)
error 

## 2023-11-01 ENCOUNTER — Other Ambulatory Visit (HOSPITAL_BASED_OUTPATIENT_CLINIC_OR_DEPARTMENT_OTHER): Payer: Self-pay

## 2023-12-17 ENCOUNTER — Encounter: Payer: Self-pay | Admitting: Orthopaedic Surgery

## 2023-12-17 ENCOUNTER — Ambulatory Visit: Payer: Commercial Managed Care - PPO | Admitting: Orthopaedic Surgery

## 2023-12-17 VITALS — BP 115/72 | HR 69 | Ht 66.0 in | Wt 132.0 lb

## 2023-12-17 DIAGNOSIS — M51369 Other intervertebral disc degeneration, lumbar region without mention of lumbar back pain or lower extremity pain: Secondary | ICD-10-CM | POA: Diagnosis not present

## 2023-12-17 MED ORDER — PREDNISONE 5 MG PO TABS: ORAL_TABLET | ORAL | 0 refills | Status: DC

## 2023-12-17 NOTE — Progress Notes (Signed)
 Office Visit Note   Patient: Rebecca Gallegos           Date of Birth: 1966-05-20           MRN: [id number] Visit Date: 12/17/2023              Requested by: Sheliah Hatch, MD [address] [place],  [place] [address] PCP: Sheliah Hatch, MD   Assessment & Plan: Visit Diagnoses:  1. Degeneration of intervertebral disc of lumbar region, unspecified whether pain present     Plan: Will place on prednisone 5 mg she can take 4 tablets p.o. daily x 5 days then stop.  If she has persistent problems she will let us know.  We reviewed plain lumbar x-rays which showed 50% narrowing at the L4-5 level where she had previous surgery years ago.  Follow-Up Instructions: Return if symptoms worsen or fail to improve.   Orders:  No orders of the defined types were placed in this encounter.  Meds ordered this encounter  Medications   predniSONE (DELTASONE) 5 MG tablet    Sig: Take 4 po daily times 5 days then stop    Dispense:  20 tablet    Refill:  0      Procedures: No procedures performed   Clinical Data: No additional findings.   Subjective: Chief Complaint  Patient presents with   multiple joint pain    HPI 58 year old female long-term patient greater than 25 years and is here with pain in multiple areas primarily in her trochanteric regions.  Patient has had previous L4-5 lumbar surgery doing well.  She has been on Wegovy lost 40+ pounds and is now on a reduced maintenance dose and weight is stable.  She has been going to formal yoga class once a week.  She has had some bilateral knee pain little bit worse in the left than right shoulder discomfort worse right than left and also some left elbow discomfort.  She has used Aleve without relief.  Sometimes she has had some headaches.  Primarily back and bilateral trochanteric pain has been her principal problem.  Review of Systems previous C5-6 fusion anteriorly.  Decompression L4-5 with disc space narrowing.   History of prediabetes.  All systems noncontributory to HPI.  Hypothyroidism on supplementation.   Objective: Vital Signs: BP 115/72   Pulse 69   Ht 5\' 6"  (1.676 m)   Wt 132 lb (59.9 kg)   LMP 01/18/2014   BMI 21.31 kg/m   Physical Exam Constitutional:      Appearance: She is well-developed.  HENT:     Head: Normocephalic.     Right Ear: External ear normal.     Left Ear: External ear normal. There is no impacted cerumen.  Eyes:     Pupils: Pupils are equal, round, and reactive to light.  Neck:     Thyroid: No thyromegaly.     Trachea: No tracheal deviation.  Cardiovascular:     Rate and Rhythm: Normal rate.  Pulmonary:     Effort: Pulmonary effort is normal.  Abdominal:     Palpations: Abdomen is soft.  Musculoskeletal:     Cervical back: No rigidity.  Skin:    General: Skin is warm and dry.  Neurological:     Mental Status: She is alert and oriented to person, place, and time.  Psychiatric:        Behavior: Behavior normal.     Ortho Exam well-healed anterior neck incision  negative Spurling.  Normal heel-toe gait.  She has some tenderness over trochanter some mild to moderate.  Minimal sciatic notch tenderness lumbar incision is well-healed.  Mild tenderness that L4-5 interspace.  Negative straight leg raising 90 degrees.  No groin pain with internal/external rotation of her hips knees reach full extension good quad strength.  Specialty Comments:  No specialty comments available.  Imaging: No results found.   PMFS History: Patient Active Problem List   Diagnosis Date Noted   Abnormal vaginal bleeding 05/31/2022   Gastroesophageal reflux disease 05/31/2022   Impaired fasting glucose 12/05/2020   Postoperative hypothyroidism 12/05/2020   Pure hypercholesterolemia 12/05/2020   Disc degeneration, lumbar 03/11/2020   Bleeding hemorrhoid 10/21/2018   Palpitations 07/23/2018   Chronic pain of right knee 05/07/2018   Bilateral hip pain 05/07/2018   Physical  exam 07/19/2017   Pre-diabetes 03/09/2016   S/P laparoscopic assisted vaginal hysterectomy (LAVH) 05/06/2015   S/P BSO (bilateral salpingo-oophorectomy) 05/06/2015   S/P endometrial ablation 04/06/2015   Family history of ovarian cancer 04/06/2015   Family history of malignant neoplasm of gastrointestinal tract 03/05/2014   Family history of malignant neoplasm of breast 03/05/2014   Chronic cough 11/22/2013   Hypothyroidism 11/27/2012   Anxiety and depression 11/27/2012   Anemia 11/27/2012   Past Medical History:  Diagnosis Date   Anemia    Anxiety    hx- has gone away since thyroid level controlled   Family history of ovarian cancer 04/06/2015   Female pelvic pain 04/06/2015   Hematometra 05/05/2015   Hypothyroidism    S/P BSO (bilateral salpingo-oophorectomy) 05/06/2015   S/P endometrial ablation 04/06/2015   S/P laparoscopic assisted vaginal hysterectomy (LAVH) 05/06/2015   Wears glasses     Family History  Problem Relation Age of Onset   Ovarian cancer Mother    Mental illness Mother    Diabetes Mother    Breast cancer Mother 63   Ovarian cancer Sister    Cancer Sister 60       ovarian   Breast cancer Sister    Cancer Maternal Aunt 35       blood cancer; unknown type   Breast cancer Maternal Aunt    Cancer Maternal Aunt    Cancer Maternal Grandmother 72       breast   Breast cancer Maternal Grandmother 85   Heart disease Maternal Grandfather    Cancer Maternal Grandfather 41       colon   Esophageal cancer Neg Hx    Rectal cancer Neg Hx    Stomach cancer Neg Hx     Past Surgical History:  Procedure Laterality Date   BACK SURGERY  10/23/2007   BREAST BIOPSY Right 12/18/2022   [place] RT BREAST BX W LOC DEV 1ST LESION IMG BX SPEC US GUIDE 12/18/2022 [facility]   BREAST BIOPSY Right 12/18/2022   MM RT BREAST BX W LOC DEV 1ST LESION IMAGE BX SPEC STEREO GUIDE 12/18/2022 [facility]   BRONCHIAL WASHINGS  07/04/2020   Procedure: BRONCHIAL WASHINGS;  Surgeon:  Leslye Peer, MD;  Location: [facility];  Service: Cardiopulmonary;;   CERVICAL SPINE SURGERY     LAPAROSCOPIC ASSISTED VAGINAL HYSTERECTOMY N/A 05/06/2015   Procedure: LAPAROSCOPIC ASSISTED VAGINAL HYSTERECTOMY;  Surgeon: Sherian Rein, MD;  Location: [facility];  Service: Gynecology;  Laterality: N/A;   SALPINGOOPHORECTOMY Bilateral 05/06/2015   Procedure: SALPINGO OOPHORECTOMY;  Surgeon: Sherian Rein, MD;  Location: [facility];  Service: Gynecology;  Laterality: Bilateral;   THYROIDECTOMY  TOTAL VAGINAL HYSTERECTOMY     TUBAL LIGATION  10/22/1989   bilateral   VIDEO BRONCHOSCOPY N/A 07/04/2020   Procedure: VIDEO BRONCHOSCOPY WITHOUT FLUORO;  Surgeon: Leslye Peer, MD;  Location: [facility];  Service: Cardiopulmonary;  Laterality: N/A;   Social History   Occupational History   Not on file  Tobacco Use   Smoking status: Former    Current packs/day: 0.00    Average packs/day: 1 pack/day for 7.0 years (7.0 ttl pk-yrs)    Types: Cigarettes    Start date: 10/22/1989    Quit date: 10/22/1996    Years since quitting: 27.1   Smokeless tobacco: Never  Vaping Use   Vaping status: Never Used  Substance and Sexual Activity   Alcohol use: Not Currently    Alcohol/week: 5.0 standard drinks of alcohol    Types: 5 Standard drinks or equivalent per week    Comment: daily- wine   Drug use: No   Sexual activity: Not Currently

## 2023-12-25 ENCOUNTER — Other Ambulatory Visit (HOSPITAL_BASED_OUTPATIENT_CLINIC_OR_DEPARTMENT_OTHER): Payer: Self-pay

## 2023-12-26 ENCOUNTER — Other Ambulatory Visit: Payer: Self-pay

## 2023-12-27 ENCOUNTER — Other Ambulatory Visit (HOSPITAL_BASED_OUTPATIENT_CLINIC_OR_DEPARTMENT_OTHER): Payer: Self-pay

## 2023-12-28 ENCOUNTER — Other Ambulatory Visit (HOSPITAL_BASED_OUTPATIENT_CLINIC_OR_DEPARTMENT_OTHER): Payer: Self-pay

## 2023-12-30 ENCOUNTER — Encounter (HOSPITAL_BASED_OUTPATIENT_CLINIC_OR_DEPARTMENT_OTHER): Payer: Self-pay

## 2023-12-30 ENCOUNTER — Other Ambulatory Visit (HOSPITAL_BASED_OUTPATIENT_CLINIC_OR_DEPARTMENT_OTHER): Payer: Self-pay

## 2023-12-31 ENCOUNTER — Telehealth: Payer: Self-pay

## 2023-12-31 ENCOUNTER — Other Ambulatory Visit (HOSPITAL_BASED_OUTPATIENT_CLINIC_OR_DEPARTMENT_OTHER): Payer: Self-pay

## 2023-12-31 ENCOUNTER — Other Ambulatory Visit (HOSPITAL_COMMUNITY): Payer: Self-pay

## 2023-12-31 NOTE — Telephone Encounter (Signed)
 Patient called and notes she needs PA for Xanax prescription please start if not already

## 2023-12-31 NOTE — Telephone Encounter (Signed)
 Pharmacy Patient Advocate Encounter   Received notification from Pt Calls Messages that prior authorization for ALPRAZOLAM 0.5MG  is required/requested.   Insurance verification completed.   The patient is insured through [facility] .   Per test claim: The current 30 day co-pay is, $5.  No PA needed at this time. This test claim was processed through [facility]- copay amounts may vary at other pharmacies due to pharmacy/plan contracts, or as the patient moves through the different stages of their insurance plan.

## 2023-12-31 NOTE — Telephone Encounter (Signed)
 Per test claim: The current 30 day co-pay is, $5.  No PA needed at this time. This test claim was processed through [facility]- copay amounts may vary at other pharmacies due to pharmacy/plan contracts, or as the patient moves through the different stages of their insurance plan.

## 2023-12-31 NOTE — Telephone Encounter (Signed)
 Left vm to call office

## 2024-01-01 ENCOUNTER — Other Ambulatory Visit (HOSPITAL_BASED_OUTPATIENT_CLINIC_OR_DEPARTMENT_OTHER): Payer: Self-pay

## 2024-01-01 NOTE — Telephone Encounter (Signed)
 Left vm to call office

## 2024-01-02 ENCOUNTER — Other Ambulatory Visit: Payer: Self-pay

## 2024-01-02 DIAGNOSIS — F32A Depression, unspecified: Secondary | ICD-10-CM

## 2024-01-02 MED ORDER — ALPRAZOLAM 0.5 MG PO TABS: 0.5000 mg | ORAL_TABLET | Freq: Two times a day (BID) | ORAL | 3 refills | Status: DC | PRN

## 2024-01-02 NOTE — Addendum Note (Signed)
 Addended by: Sheliah Hatch on: 01/02/2024 04:33 PM   Modules accepted: Orders

## 2024-01-02 NOTE — Telephone Encounter (Signed)
 Pt has been notified.

## 2024-01-13 ENCOUNTER — Encounter: Payer: Commercial Managed Care - PPO | Admitting: Family Medicine

## 2024-01-23 ENCOUNTER — Encounter: Payer: Self-pay | Admitting: Family Medicine

## 2024-01-23 ENCOUNTER — Ambulatory Visit (INDEPENDENT_AMBULATORY_CARE_PROVIDER_SITE_OTHER): Admitting: Family Medicine

## 2024-01-23 VITALS — BP 110/70 | HR 72 | Temp 98.7°F | Ht 66.0 in | Wt 132.8 lb

## 2024-01-23 DIAGNOSIS — E039 Hypothyroidism, unspecified: Secondary | ICD-10-CM

## 2024-01-23 DIAGNOSIS — Z0000 Encounter for general adult medical examination without abnormal findings: Secondary | ICD-10-CM

## 2024-01-23 NOTE — Progress Notes (Signed)
   Subjective:    Patient ID: Rebecca Gallegos, female    DOB: 12-22-1965, 58 y.o.   MRN: [id number]  HPI CPE- UTD on mammo, colonoscopy, Tdap  Patient Care Team    Relationship Specialty Notifications Start End  Sheliah Hatch, MD PCP - General Family Medicine  03/12/16   Dorisann Frames, MD Consulting Physician Endocrinology  07/31/17     Health Maintenance  Topic Date Due   MAMMOGRAM  12/05/2023   INFLUENZA VACCINE  05/22/2024   Colonoscopy  02/01/2025   DTaP/Tdap/Td (3 - Td or Tdap) 01/04/2032   Hepatitis C Screening  Completed   HIV Screening  Completed   Zoster Vaccines- Shingrix  Completed   HPV VACCINES  Aged Out   COVID-19 Vaccine  Discontinued      Review of Systems Patient reports no vision/ hearing changes, adenopathy,fever, weight change,  persistant/recurrent hoarseness , swallowing issues, chest pain, palpitations, edema, persistant/recurrent cough, hemoptysis, dyspnea (rest/exertional/paroxysmal nocturnal), gastrointestinal bleeding (melena, rectal bleeding), abdominal pain, significant heartburn, bowel changes, GU symptoms (dysuria, hematuria, incontinence), Gyn symptoms (abnormal  bleeding, pain),  syncope, focal weakness, memory loss, numbness & tingling, skin/hair/nail changes, abnormal bruising or bleeding, anxiety, or depression.     Objective:   Physical Exam General Appearance:    Alert, cooperative, no distress, appears stated age  Head:    Normocephalic, without obvious abnormality, atraumatic  Eyes:    PERRL, conjunctiva/corneas clear, EOM's intact both eyes  Ears:    Normal TM's and external ear canals, both ears  Nose:   Nares normal, septum midline, mucosa normal, no drainage    or sinus tenderness  Throat:   Lips, mucosa, and tongue normal; teeth and gums normal  Neck:   Supple, symmetrical, trachea midline, no adenopathy;    Thyroid: no enlargement/tenderness/nodules  Back:     Symmetric, no curvature, ROM normal, no CVA tenderness   Lungs:     Clear to auscultation bilaterally, respirations unlabored  Chest Wall:    No tenderness or deformity   Heart:    Regular rate and rhythm, S1 and S2 normal, no murmur, rub   or gallop  Breast Exam:    Deferred to GYN  Abdomen:     Soft, non-tender, bowel sounds active all four quadrants,    no masses, no organomegaly  Genitalia:    Deferred to GYN  Rectal:    Extremities:   Extremities normal, atraumatic, no cyanosis or edema  Pulses:   2+ and symmetric all extremities  Skin:   Skin color, texture, turgor normal, no rashes or lesions  Lymph nodes:   Cervical, supraclavicular, and axillary nodes normal  Neurologic:   CNII-XII intact, normal strength, sensation and reflexes    throughout          Assessment & Plan:

## 2024-01-23 NOTE — Assessment & Plan Note (Signed)
Pt's PE WNL.  UTD on mammo, colonoscopy, immunizations.  Check labs.  Anticipatory guidance provided.

## 2024-01-23 NOTE — Patient Instructions (Signed)
 Follow up in 1 year or as needed We'll notify you of your lab results and make any changes if needed Keep up the good work on healthy diet and regular exercise- you look great! Call with any questions or concerns Stay Safe!  Stay Healthy! HAPPY BELATED BIRTHDAY!!!

## 2024-01-24 ENCOUNTER — Encounter: Payer: Self-pay | Admitting: Family Medicine

## 2024-01-24 LAB — CBC WITH DIFFERENTIAL/PLATELET
Basophils Absolute: 0.1 10*3/uL (ref 0.0–0.1)
Basophils Relative: 1.8 % (ref 0.0–3.0)
Eosinophils Absolute: 0.1 10*3/uL (ref 0.0–0.7)
Eosinophils Relative: 2.1 % (ref 0.0–5.0)
HCT: 40.9 % (ref 36.0–46.0)
Hemoglobin: 13.9 g/dL (ref 12.0–15.0)
Lymphocytes Relative: 42.8 % (ref 12.0–46.0)
Lymphs Abs: 2.2 10*3/uL (ref 0.7–4.0)
MCHC: 33.9 g/dL (ref 30.0–36.0)
MCV: 88.4 fl (ref 78.0–100.0)
Monocytes Absolute: 0.4 10*3/uL (ref 0.1–1.0)
Monocytes Relative: 7.7 % (ref 3.0–12.0)
Neutro Abs: 2.3 10*3/uL (ref 1.4–7.7)
Neutrophils Relative %: 45.6 % (ref 43.0–77.0)
Platelets: 239 10*3/uL (ref 150.0–400.0)
RBC: 4.63 Mil/uL (ref 3.87–5.11)
RDW: 14.4 % (ref 11.5–15.5)
WBC: 5.2 10*3/uL (ref 4.0–10.5)

## 2024-01-24 LAB — HEPATIC FUNCTION PANEL
ALT: 12 U/L (ref 0–35)
AST: 14 U/L (ref 0–37)
Albumin: 4.9 g/dL (ref 3.5–5.2)
Alkaline Phosphatase: 37 U/L — ABNORMAL LOW (ref 39–117)
Bilirubin, Direct: 0.1 mg/dL (ref 0.0–0.3)
Total Bilirubin: 0.6 mg/dL (ref 0.2–1.2)
Total Protein: 7.2 g/dL (ref 6.0–8.3)

## 2024-01-24 LAB — LIPID PANEL
Cholesterol: 208 mg/dL — ABNORMAL HIGH (ref 0–200)
HDL: 83.8 mg/dL (ref >39.00)
LDL Cholesterol: 115 mg/dL — ABNORMAL HIGH (ref 0–99)
NonHDL: 124.02
Total CHOL/HDL Ratio: 2
Triglycerides: 43 mg/dL (ref 0.0–149.0)
VLDL: 8.6 mg/dL (ref 0.0–40.0)

## 2024-01-24 LAB — BASIC METABOLIC PANEL WITH GFR
BUN: 15 mg/dL (ref 6–23)
CO2: 31 meq/L (ref 19–32)
Calcium: 9.7 mg/dL (ref 8.4–10.5)
Chloride: 102 meq/L (ref 96–112)
Creatinine, Ser: 0.69 mg/dL (ref 0.40–1.20)
GFR: 95.26 mL/min (ref >60.00)
Glucose, Bld: 78 mg/dL (ref 70–99)
Potassium: 4.8 meq/L (ref 3.5–5.1)
Sodium: 140 meq/L (ref 135–145)

## 2024-01-24 NOTE — Telephone Encounter (Signed)
-----   Message from Neena Rhymes sent at 01/24/2024  2:58 PM EDT ----- Labs look great!  No changes at this time

## 2024-01-24 NOTE — Telephone Encounter (Signed)
 Lab results have been discussed.   Verbalized understanding? Yes  Are there any questions? No

## 2024-01-26 ENCOUNTER — Encounter: Payer: Self-pay | Admitting: Family Medicine

## 2024-01-27 ENCOUNTER — Telehealth: Payer: Self-pay

## 2024-01-27 NOTE — Telephone Encounter (Signed)
 Sent to PCP ?

## 2024-01-28 ENCOUNTER — Other Ambulatory Visit (HOSPITAL_BASED_OUTPATIENT_CLINIC_OR_DEPARTMENT_OTHER): Payer: Self-pay | Admitting: Nurse Practitioner

## 2024-01-28 DIAGNOSIS — E7800 Pure hypercholesterolemia, unspecified: Secondary | ICD-10-CM

## 2024-01-28 NOTE — Telephone Encounter (Signed)
 Faxed

## 2024-01-28 NOTE — Telephone Encounter (Signed)
 Form completed and returned to [place]

## 2024-02-18 ENCOUNTER — Ambulatory Visit (HOSPITAL_BASED_OUTPATIENT_CLINIC_OR_DEPARTMENT_OTHER)
Admission: RE | Admit: 2024-02-18 | Discharge: 2024-02-18 | Disposition: A | Discharge status: Home / Self Care | Payer: Self-pay | Source: Ambulatory Visit | Attending: Nurse Practitioner | Admitting: Nurse Practitioner

## 2024-02-18 DIAGNOSIS — E7800 Pure hypercholesterolemia, unspecified: Secondary | ICD-10-CM | POA: Insufficient documentation

## 2024-02-21 ENCOUNTER — Ambulatory Visit

## 2024-03-19 ENCOUNTER — Other Ambulatory Visit (HOSPITAL_BASED_OUTPATIENT_CLINIC_OR_DEPARTMENT_OTHER): Payer: Self-pay

## 2024-03-20 ENCOUNTER — Other Ambulatory Visit (HOSPITAL_BASED_OUTPATIENT_CLINIC_OR_DEPARTMENT_OTHER): Payer: Self-pay

## 2024-03-20 MED ORDER — WEGOVY 1.7 MG/0.75ML ~~LOC~~ SOAJ
1.7000 mg | SUBCUTANEOUS | 3 refills | Status: DC
  Filled 2024-03-20: qty 3, 28d supply, fill #0
  Filled 2024-04-25: qty 3, 28d supply, fill #1
  Filled 2024-05-19: qty 3, 28d supply, fill #2
  Filled 2024-06-15: qty 3, 28d supply, fill #3

## 2024-04-01 ENCOUNTER — Other Ambulatory Visit: Payer: Self-pay | Admitting: Family Medicine

## 2024-04-08 ENCOUNTER — Other Ambulatory Visit: Payer: Self-pay

## 2024-04-27 ENCOUNTER — Other Ambulatory Visit (HOSPITAL_BASED_OUTPATIENT_CLINIC_OR_DEPARTMENT_OTHER): Payer: Self-pay

## 2024-05-26 ENCOUNTER — Other Ambulatory Visit: Payer: Self-pay

## 2024-05-26 DIAGNOSIS — F419 Anxiety disorder, unspecified: Secondary | ICD-10-CM

## 2024-05-26 MED ORDER — ALPRAZOLAM 0.5 MG PO TABS: 0.5000 mg | ORAL_TABLET | Freq: Two times a day (BID) | ORAL | 3 refills | Status: DC | PRN

## 2024-05-26 NOTE — Telephone Encounter (Signed)
 Requested Prescriptions   Pending Prescriptions Disp Refills   ALPRAZolam  (XANAX ) 0.5 MG tablet 60 tablet 3    Sig: Take 1 tablet (0.5 mg total) by mouth 2 (two) times daily as needed. for anxiety     Date of patient request: 05/26/2024 Last office visit: 01/23/2024 Upcoming visit: 01/27/2025 Date of last refill: 01/02/2024 Last refill amount: 60 x 3

## 2024-06-15 ENCOUNTER — Other Ambulatory Visit (HOSPITAL_BASED_OUTPATIENT_CLINIC_OR_DEPARTMENT_OTHER): Payer: Self-pay

## 2024-07-08 ENCOUNTER — Other Ambulatory Visit (HOSPITAL_BASED_OUTPATIENT_CLINIC_OR_DEPARTMENT_OTHER): Payer: Self-pay

## 2024-07-08 MED ORDER — WEGOVY 1.7 MG/0.75ML ~~LOC~~ SOAJ
1.7000 mg | SUBCUTANEOUS | 3 refills | Status: DC
  Filled 2024-07-08: qty 3, 28d supply, fill #0
  Filled 2024-08-09: qty 3, 28d supply, fill #1
  Filled 2024-09-02: qty 3, 28d supply, fill #2
  Filled 2024-09-25: qty 3, 28d supply, fill #3

## 2024-07-13 ENCOUNTER — Other Ambulatory Visit (HOSPITAL_BASED_OUTPATIENT_CLINIC_OR_DEPARTMENT_OTHER): Payer: Self-pay

## 2024-07-16 ENCOUNTER — Ambulatory Visit: Admitting: Family Medicine

## 2024-07-16 ENCOUNTER — Other Ambulatory Visit (HOSPITAL_BASED_OUTPATIENT_CLINIC_OR_DEPARTMENT_OTHER): Payer: Self-pay

## 2024-07-16 ENCOUNTER — Encounter: Payer: Self-pay | Admitting: Family Medicine

## 2024-07-16 VITALS — BP 110/62 | HR 68 | Temp 97.8°F | Ht 66.0 in | Wt 134.4 lb

## 2024-07-16 DIAGNOSIS — R4189 Other symptoms and signs involving cognitive functions and awareness: Secondary | ICD-10-CM | POA: Diagnosis not present

## 2024-07-16 DIAGNOSIS — G4452 New daily persistent headache (NDPH): Secondary | ICD-10-CM | POA: Diagnosis not present

## 2024-07-16 DIAGNOSIS — R442 Other hallucinations: Secondary | ICD-10-CM

## 2024-07-16 NOTE — Progress Notes (Signed)
   Subjective:    Patient ID: Rebecca Gallegos, female    DOB: Aug 19, 1966, 58 y.o.   MRN: [id number]  HPI Headache- 'basically a constant headache and nothing touches it'.  Sxs started in April but have been worsening.  Having constant tinnitus.  Pain is back of her head.  ~1 month ago smelled rubber when it wasn't there.  No one else could smell it.  Having issues w/ memory- it's impacting her work.  Feeling foggy, confused- 'i'm not crisp, I'm not sharp.  It's frustrating'.  Able to read a book and comprehend and remember w/o difficulty but having a hard time reasoning through processes- particularly at work.  Forgot she went to visit her dad this summer.  No N/V, visual changes.  No dizziness but will have trouble going down stairs at times- 'like I lose my rhythm'.   Review of Systems For ROS see HPI     Objective:   Physical Exam Vitals reviewed.  Constitutional:      General: She is not in acute distress.    Appearance: She is well-developed. She is not ill-appearing.  HENT:     Head: Normocephalic and atraumatic.  Eyes:     General: No visual field deficit.    Extraocular Movements: Extraocular movements intact.     Right eye: No nystagmus.     Left eye: No nystagmus.     Pupils: Pupils are equal, round, and reactive to light.  Cardiovascular:     Rate and Rhythm: Normal rate and regular rhythm.  Pulmonary:     Effort: Pulmonary effort is normal. No respiratory distress.  Musculoskeletal:     Cervical back: Normal range of motion and neck supple.  Lymphadenopathy:     Cervical: No cervical adenopathy.  Neurological:     Mental Status: She is alert and oriented to person, place, and time.     Cranial Nerves: No cranial nerve deficit, dysarthria or facial asymmetry.     Motor: No weakness.     Coordination: Coordination normal.     Gait: Gait normal.     Deep Tendon Reflexes: Reflexes normal.  Psychiatric:        Mood and Affect: Mood normal.        Speech: Speech  normal.        Behavior: Behavior normal.           Assessment & Plan:  New daily persistent HA w/ cognitive changes and recent olfactory hallucinations- new.  Sxs started in April but have worsened over time and are now constant.  HA pain is bothersome but most concerning to pt is her cognitive changes and the impact it's having on work.  Has normal neuro exam in office.  Will get MRI to assess given the olfactory hallucinations.  Refer to Neuro.  Reviewed supportive care and red flags that should prompt return.  Pt expressed understanding and is in agreement w/ plan.

## 2024-07-16 NOTE — Patient Instructions (Signed)
 Follow up as needed or as scheduled We'll call you to schedule your MRI and your Neurology appt Make sure you drinking LOTS of fluids REST when needed Call with any questions or concerns Hang in there!!

## 2024-07-17 ENCOUNTER — Ambulatory Visit: Payer: Self-pay | Admitting: Family Medicine

## 2024-07-17 ENCOUNTER — Ambulatory Visit
Admission: RE | Admit: 2024-07-17 | Discharge: 2024-07-17 | Disposition: A | Discharge status: Home / Self Care | Source: Ambulatory Visit | Attending: Family Medicine | Admitting: Family Medicine

## 2024-07-17 DIAGNOSIS — G4452 New daily persistent headache (NDPH): Secondary | ICD-10-CM

## 2024-07-17 DIAGNOSIS — R442 Other hallucinations: Secondary | ICD-10-CM

## 2024-07-17 DIAGNOSIS — R4189 Other symptoms and signs involving cognitive functions and awareness: Secondary | ICD-10-CM

## 2024-07-17 MED ORDER — GADOPICLENOL 0.5 MMOL/ML IV SOLN
6.0000 mL | Freq: Once | INTRAVENOUS | Status: AC | PRN
  Administered 2024-07-17: 6 mL via INTRAVENOUS

## 2024-07-17 NOTE — Progress Notes (Signed)
 Pt has been notified.

## 2024-08-24 ENCOUNTER — Encounter: Payer: Self-pay | Admitting: Radiology

## 2024-09-03 ENCOUNTER — Telehealth: Admitting: Family Medicine

## 2024-09-03 DIAGNOSIS — M5441 Lumbago with sciatica, right side: Secondary | ICD-10-CM

## 2024-09-03 MED ORDER — METHOCARBAMOL 750 MG PO TABS: 750.0000 mg | ORAL_TABLET | Freq: Four times a day (QID) | ORAL | 0 refills | Status: AC

## 2024-09-03 NOTE — Progress Notes (Signed)
 We are sorry that you are not feeling well.  Here is how we plan to help!  Based on what you have shared with me it looks like you mostly have acute back pain.  Acute back pain is defined as musculoskeletal pain that can resolve in 1-3 weeks with conservative treatment.  I have prescribed methocarbamol .   Some patients experience stomach irritation or in increased heartburn with anti-inflammatory drugs.  Please keep in mind that muscle relaxer's can cause fatigue and should not be taken while at work or driving.  Back pain is very common.  The pain often gets better over time.  The cause of back pain is usually not dangerous.  Most people can learn to manage their back pain on their own.  Home Care Stay active.  Start with short walks on flat ground if you can.  Try to walk farther each day. Do not sit, drive or stand in one place for more than 30 minutes.  Do not stay in bed. Do not avoid exercise or work.  Activity can help your back heal faster. Be careful when you bend or lift an object.  Bend at your knees, keep the object close to you, and do not twist. Sleep on a firm mattress.  Lie on your side, and bend your knees.  If you lie on your back, put a pillow under your knees. Only take medicines as told by your doctor. Put ice on the injured area. Put ice in a plastic bag Place a towel between your skin and the bag Leave the ice on for 15-20 minutes, 3-4 times a day for the first 2-3 days. 210 After that, you can switch between ice and heat packs. Ask your doctor about back exercises or massage. Avoid feeling anxious or stressed.  Find good ways to deal with stress, such as exercise.  Get Help Right Way If: Your pain does not go away with rest or medicine. Your pain does not go away in 1 week. You have new problems. You do not feel well. The pain spreads into your legs. You cannot control when you poop (bowel movement) or pee (urinate) You feel sick to your stomach (nauseous) or  throw up (vomit) You have belly (abdominal) pain. You feel like you may pass out (faint). If you develop a fever.  Make Sure you: Understand these instructions. Will watch your condition Will get help right away if you are not doing well or get worse.  Your e-visit answers were reviewed by a board certified advanced clinical practitioner to complete your personal care plan.  Depending on the condition, your plan could have included both over the counter or prescription medications.  If there is a problem please reply  once you have received a response from your provider.  Your safety is important to us .  If you have drug allergies check your prescription carefully.    You can use MyChart to ask questions about today's visit, request a non-urgent call back, or ask for a work or school excuse for 24 hours related to this e-Visit. If it has been greater than 24 hours you will need to follow up with your provider, or enter a new e-Visit to address those concerns.  You will get an e-mail in the next two days asking about your experience.  I hope that your e-visit has been valuable and will speed your recovery. Thank you for using e-visits.   I have spent 5 minutes in review of e-visit questionnaire,  review and updating patient chart, medical decision making and response to patient.   Niemah Schwebke, FNP

## 2024-09-14 ENCOUNTER — Other Ambulatory Visit: Payer: Self-pay | Admitting: Family Medicine

## 2024-09-25 ENCOUNTER — Encounter: Payer: Self-pay | Admitting: Family Medicine

## 2024-09-25 DIAGNOSIS — F419 Anxiety disorder, unspecified: Secondary | ICD-10-CM

## 2024-09-25 MED ORDER — ALPRAZOLAM 0.5 MG PO TABS: 0.5000 mg | ORAL_TABLET | Freq: Two times a day (BID) | ORAL | 0 refills | Status: DC | PRN

## 2024-09-25 NOTE — Telephone Encounter (Signed)
 Patient is requesting a refill on her xanax . She needs her refill sent to mail order pharmacy. Okay to refill? We have not received anything from her pharmacy.

## 2024-09-25 NOTE — Telephone Encounter (Signed)
 Refill request received with PCP out of office.Recent visit September, physical in April.  Controlled substance database reviewed.  Alprazolam  0.5 mg #60 filled on 08/20/2024, previously 10/7, 9/10, 8/9.  Refill ordered.

## 2024-09-28 ENCOUNTER — Other Ambulatory Visit: Payer: Self-pay | Admitting: Family Medicine

## 2024-10-13 ENCOUNTER — Ambulatory Visit: Payer: Self-pay | Admitting: Neurology

## 2024-10-13 ENCOUNTER — Encounter: Payer: Self-pay | Admitting: Neurology

## 2024-10-13 VITALS — BP 120/71 | HR 72 | Resp 98 | Ht 66.0 in | Wt 140.0 lb

## 2024-10-13 DIAGNOSIS — H47333 Pseudopapilledema of optic disc, bilateral: Secondary | ICD-10-CM | POA: Diagnosis not present

## 2024-10-13 DIAGNOSIS — R4189 Other symptoms and signs involving cognitive functions and awareness: Secondary | ICD-10-CM

## 2024-10-13 DIAGNOSIS — H9313 Tinnitus, bilateral: Secondary | ICD-10-CM

## 2024-10-13 NOTE — Patient Instructions (Signed)
 Continue current medications  Continue to follow up with PCP  Return if worse

## 2024-10-13 NOTE — Progress Notes (Signed)
 "   [facility]  PATIENT: Rebecca Gallegos DOB: Oct 23, 1965  REQUESTING CLINICIAN: Mahlon Comer BRAVO, MD HISTORY FROM: Patient  REASON FOR VISIT: Refer from Ophthalmology for pseudopapilledema    HISTORICAL  CHIEF COMPLAINT:  Chief Complaint  Patient presents with   New Patient (Initial Visit)    Room 12 Alone Internal referral for headaches   INTERVAL HISTORY 10/13/2024 Discussed the use of AI scribe software for clinical note transcription with the patient, who gave verbal consent to proceed.  Micala Saltsman Ellouise is a 58 year old female with worsening tinnitus, concentration issues, and headaches who is presenting for follow up.  Over the past two to three months, she has experienced worsening tinnitus, concentration issues, and headaches. The tinnitus, which began around 1979, has become louder but is not constant. An MRI was performed, and it showed no acute abnormalities.  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 She experiences episodes of losing track of her thoughts and coordination issues, such as losing rhythm while descending stairs. These symptoms have improved somewhat but were concerning enough to seek further evaluation.  Headaches have become more frequent over the past year, though they are not debilitating and are managed with Aleve. She also experiences episodes of phantosmia, which have decreased in frequency.  She speculates that a possible mix-up with her husband's blood pressure medication and her antidepressant might have contributed to her symptoms, as she filled both her pill bottles at the same time. She has since resumed her antidepressant medication.  Her family history includes Alzheimer's disease on her paternal side, with her grandmother having had the condition, although her father, at 75, remains coherent. Her mother and sister  both died of ovarian cancer.  She has had her eyes checked twice in the past year, with no significant changes noted, and her vision prescription remains stable. She experiences occasional difficulty focusing her eyes during the day, which she attributes to her work on the computer.  She is currently taking Wegovy  at a dose of 1.7 mg weekly and has been on it for two and a half years. No recent illness or events could have triggered her symptoms. She reports good sleep quality and denies any significant changes in vision year to year but notes occasional difficulty focusing during the day.     HISTORY OF PRESENT ILLNESS:  This is a 58 year old woman past medical history of thyroid  disease, family history of ovarian cancer who is presenting after ophthalmology examination found pseudopapilledema.  Patient reports going to ophthalmology because she was having some dry eyes and floaters.  Due to pseudopapilledema she was referred to neurology.  She denies any headaches, denies any vision loss, denies any blurry vision or double vision.  Denies any headaches.  She reports being on Wegovy  for weight loss for the past year and a half and her doctors were concerned because Wegovy  is associated in rare cases to anterior ischemic optic neuropathy, again she denies any vision loss.  No neurological complaint, no weakness, no numbness.     OTHER MEDICAL CONDITIONS: Thyroid  disease    REVIEW OF SYSTEMS: Full 14 system review of systems performed and negative with exception of: As noted in the HPI   ALLERGIES: Allergies  Allergen Reactions   Benzoin Dermatitis    Blisters, itching, redness, hot sensation with combo of Benzoin and steri strips   Iodine Solution [Povidone Iodine]  rash   Oxycodone Itching   Codeine Rash   Iodine Rash   Mouthwashes Other (See Comments)    [facility] mouthwash irritated mouth.    HOME MEDICATIONS: Outpatient Medications Prior to Visit  Medication Sig Dispense Refill    ALPRAZolam  (XANAX ) 0.5 MG tablet Take 1 tablet (0.5 mg total) by mouth 2 (two) times daily as needed. for anxiety 60 tablet 0   buPROPion  (WELLBUTRIN  XL) 150 MG 24 hr tablet TAKE 1 TABLET DAILY 90 tablet 1   citalopram  (CELEXA ) 40 MG tablet TAKE 1 TABLET DAILY 90 tablet 1   levothyroxine  (SYNTHROID ) 125 MCG tablet Take 125 mcg by mouth daily before breakfast.     Multiple Vitamin (MULTIVITAMIN) capsule Take 1 capsule by mouth daily.     Multiple Vitamins-Minerals (HAIR SKIN AND NAILS FORMULA PO) Take by mouth.     omeprazole (PRILOSEC OTC) 20 MG tablet Take 20 mg by mouth daily.     semaglutide -weight management (WEGOVY ) 1.7 MG/0.75ML SOAJ SQ injection Inject 1.7 mg into the skin once a week. 3 mL 3   fluticasone (FLONASE) 50 MCG/ACT nasal spray Place 2 sprays into both nostrils daily.     No facility-administered medications prior to visit.    PAST MEDICAL HISTORY: Past Medical History:  Diagnosis Date   Anemia    Anxiety    hx- has gone away since thyroid  level controlled   Family history of ovarian cancer 04/06/2015   Female pelvic pain 04/06/2015   Hematometra 05/05/2015   Hypothyroidism    S/P BSO (bilateral salpingo-oophorectomy) 05/06/2015   S/P endometrial ablation 04/06/2015   S/P laparoscopic assisted vaginal hysterectomy (LAVH) 05/06/2015   Wears glasses     PAST SURGICAL HISTORY: Past Surgical History:  Procedure Laterality Date   BACK SURGERY  10/23/2007   BREAST BIOPSY Right 12/18/2022   US  RT BREAST BX W LOC DEV 1ST LESION IMG BX SPEC US  GUIDE 12/18/2022 [facility]   BREAST BIOPSY Right 12/18/2022   MM RT BREAST BX W LOC DEV 1ST LESION IMAGE BX SPEC STEREO GUIDE 12/18/2022 [facility]   BRONCHIAL WASHINGS  07/04/2020   Procedure: BRONCHIAL WASHINGS;  Surgeon: Shelah Lamar RAMAN, MD;  Location: [facility];  Service: Cardiopulmonary;;   CERVICAL SPINE SURGERY     LAPAROSCOPIC ASSISTED VAGINAL HYSTERECTOMY N/A 05/06/2015   Procedure: LAPAROSCOPIC ASSISTED  VAGINAL HYSTERECTOMY;  Surgeon: Ezzie Buba, MD;  Location: [facility];  Service: Gynecology;  Laterality: N/A;   SALPINGOOPHORECTOMY Bilateral 05/06/2015   Procedure: SALPINGO OOPHORECTOMY;  Surgeon: Ezzie Buba, MD;  Location: [facility];  Service: Gynecology;  Laterality: Bilateral;   THYROIDECTOMY     TOTAL VAGINAL HYSTERECTOMY     TUBAL LIGATION  10/22/1989   bilateral   VIDEO BRONCHOSCOPY N/A 07/04/2020   Procedure: VIDEO BRONCHOSCOPY WITHOUT FLUORO;  Surgeon: Shelah Lamar RAMAN, MD;  Location: [facility];  Service: Cardiopulmonary;  Laterality: N/A;    FAMILY HISTORY: Family History  Problem Relation Age of Onset   Ovarian cancer Mother    Mental illness Mother    Diabetes Mother    Breast cancer Mother 79   Ovarian cancer Sister    Cancer Sister 65       ovarian   Breast cancer Sister    Cancer Maternal Aunt 35       blood cancer; unknown type   Breast cancer Maternal Aunt    Cancer Maternal Aunt    Cancer Maternal Grandmother 32       breast   Breast cancer Maternal  Grandmother 85   Heart disease Maternal Grandfather    Cancer Maternal Grandfather 35       colon   Esophageal cancer Neg Hx    Rectal cancer Neg Hx    Stomach cancer Neg Hx     SOCIAL HISTORY: Social History   Socioeconomic History   Marital status: Married    Spouse name: Not on file   Number of children: 2   Years of education: Not on file   Highest education level: Some college, no degree  Occupational History   Not on file  Tobacco Use   Smoking status: Former    Current packs/day: 0.00    Average packs/day: 1 pack/day for 7.0 years (7.0 ttl pk-yrs)    Types: Cigarettes    Start date: 10/22/1989    Quit date: 10/22/1996    Years since quitting: 27.9   Smokeless tobacco: Never  Vaping Use   Vaping status: Never Used  Substance and Sexual Activity   Alcohol use: Not Currently    Alcohol/week: 5.0 standard drinks of alcohol    Types: 5 Standard drinks or equivalent per week     Comment: daily- wine   Drug use: No   Sexual activity: Not Currently  Other Topics Concern   Not on file  Social History Narrative   Not on file   Social Drivers of Health   Tobacco Use: Medium Risk (10/13/2024)   Patient History    Smoking Tobacco Use: Former    Smokeless Tobacco Use: Never    Passive Exposure: Not on file  Financial Resource Strain: Low Risk (01/20/2024)   Overall Financial Resource Strain (CARDIA)    Difficulty of Paying Living Expenses: Not hard at all  Food Insecurity: No Food Insecurity (01/20/2024)   Hunger Vital Sign    Worried About Running Out of Food in the Last Year: Never true    Ran Out of Food in the Last Year: Never true  Transportation Needs: No Transportation Needs (01/20/2024)   PRAPARE - Administrator, Civil Service (Medical): No    Lack of Transportation (Non-Medical): No  Physical Activity: Insufficiently Active (01/20/2024)   Exercise Vital Sign    Days of Exercise per Week: 2 days    Minutes of Exercise per Session: 10 min  Stress: Stress Concern Present (01/20/2024)   [employer] of Occupational Health - Occupational Stress Questionnaire    Feeling of Stress : To some extent  Social Connections: Moderately Isolated (01/20/2024)   Social Connection and Isolation Panel    Frequency of Communication with Friends and Family: More than three times a week    Frequency of Social Gatherings with Friends and Family: Three times a week    Attends Religious Services: Never    Active Member of Clubs or Organizations: No    Attends Banker Meetings: Not on file    Marital Status: Married  Catering Manager Violence: Not on file  Depression (PHQ2-9): Low Risk (07/16/2024)   Depression (PHQ2-9)    PHQ-2 Score: 4  Alcohol Screen: Low Risk (01/20/2024)   Alcohol Screen    Last Alcohol Screening Score (AUDIT): 2  Housing: Low Risk (01/20/2024)   Housing Stability Vital Sign    Unable to Pay for Housing in the Last Year: No     Number of Times Moved in the Last Year: 0    Homeless in the Last Year: No  Utilities: Low Risk (12/31/2022)   Received from [facility]  Utilities    In the past 12 months has the electric, gas, oil, or water  company threatened to shut off services in your home? : No  Health Literacy: Not on file    PHYSICAL EXAM   GENERAL EXAM/CONSTITUTIONAL: Vitals:  Vitals:   10/13/24 0905  BP: 120/71  Pulse: 72  Resp: (!) 98  Weight: 140 lb (63.5 kg)  Height: 5' 6 (1.676 m)   Body mass index is 22.6 kg/m. Wt Readings from Last 3 Encounters:  10/13/24 140 lb (63.5 kg)  07/16/24 134 lb 6 oz (61 kg)  01/23/24 132 lb 12.8 oz (60.2 kg)   Patient is in no distress; well developed, nourished and groomed; neck is supple  MUSCULOSKELETAL: Gait, strength, tone, movements noted in Neurologic exam below  NEUROLOGIC: MENTAL STATUS:      No data to display            10/13/2024    9:41 AM  Montreal Cognitive Assessment   Visuospatial/ Executive (0/5) 5  Naming (0/3) 3  Attention: Read list of digits (0/2) 2  Attention: Read list of letters (0/1) 1  Attention: Serial 7 subtraction starting at 100 (0/3) 3  Language: Repeat phrase (0/2) 2  Language : Fluency (0/1) 1  Abstraction (0/2) 2  Delayed Recall (0/5) 4  Orientation (0/6) 6  Total 29    awake, alert, oriented to person, place and time recent and remote memory intact normal attention and concentration language fluent, comprehension intact, naming intact fund of knowledge appropriate  CRANIAL NERVE:  2nd, 3rd, 4th, 6th - Unable to assess optic disc on funduscopy Visual fields full to confrontation, extraocular muscles intact, no nystagmus 5th - facial sensation symmetric 7th - facial strength symmetric 8th - hearing intact 9th - palate elevates symmetrically, uvula midline 11th - shoulder shrug symmetric 12th - tongue protrusion midline  MOTOR:  normal bulk and tone, full strength in the BUE, BLE  SENSORY:   normal and symmetric to light touch  COORDINATION:  finger-nose-finger, fine finger movements normal  GAIT/STATION:  normal  DIAGNOSTIC DATA (LABS, IMAGING, TESTING) - I reviewed patient records, labs, notes, testing and imaging myself where available.  Lab Results  Component Value Date   WBC 5.2 01/23/2024   HGB 13.9 01/23/2024   HCT 40.9 01/23/2024   MCV 88.4 01/23/2024   PLT 239.0 01/23/2024      Component Value Date/Time   NA 140 01/23/2024 1453   NA 140 10/30/2019 0000   K 4.8 01/23/2024 1453   CL 102 01/23/2024 1453   CO2 31 01/23/2024 1453   GLUCOSE 78 01/23/2024 1453   BUN 15 01/23/2024 1453   BUN 21 10/30/2019 0000   CREATININE 0.69 01/23/2024 1453   CALCIUM 9.7 01/23/2024 1453   PROT 7.2 01/23/2024 1453   ALBUMIN 4.9 01/23/2024 1453   AST 14 01/23/2024 1453   ALT 12 01/23/2024 1453   ALKPHOS 37 (L) 01/23/2024 1453   BILITOT 0.6 01/23/2024 1453   GFRNONAA >60 05/07/2015 0516   GFRAA >60 05/07/2015 0516   Lab Results  Component Value Date   CHOL 208 (H) 01/23/2024   HDL 83.80 01/23/2024   LDLCALC 115 (H) 01/23/2024   TRIG 43.0 01/23/2024   CHOLHDL 2 01/23/2024   Lab Results  Component Value Date   HGBA1C 5.5 10/30/2019   No results found for: [id number] Lab Results  Component Value Date   TSH 0.27 (L) 10/29/2022   MRI Brain 07/17/2024 1. Normal brain MRI. No acute intracranial  abnormality. 2. No mass or abnormal enhancement.  ASSESSMENT AND PLAN  58 y.o. year old female with thyroid  disease, family history of ovarian cancer who is presenting for follow up.   Papilledema with occasional visual obscurations Papilledema with occasional visual obscurations, likely due to idiopathic intracranial hypertension. MRI with contrast showed no space-occupying lesions. Symptoms include visual obscurations and occasional headaches, but no significant vision changes. Differential diagnosis includes idiopathic intracranial hypertension, which requires  lumbar puncture for confirmation. Discussed the procedure of lumbar puncture.  - Continue to monitor symptoms and report any significant changes in headaches or vision. - If symptoms worsen, will consider lumbar puncture to measure intracranial pressure.  Tinnitus Chronic tinnitus, likely related to past exposure to loud noise. Symptoms have been present since the 80s and are worsening in intensity. No neurological cause identified on MRI. - Continue to monitor symptoms and manage conservatively.  Subjective cognitive impairment Symptoms of concentration difficulties and logical thought issues. Symptoms have improved over time. MRI was normal, ruling out structural causes. MOCA normal at 29/30. Family history of Alzheimer's disease noted, but current symptoms are not concerning for dementia. Discussed the importance of establishing a baseline memory score for future comparison. - Performed memory test to establish baseline. - Continue to monitor cognitive function and report any significant changes.    1. Pseudopapilledema of both optic discs   2. Tinnitus of both ears   3. Subjective memory complaints      Patient Instructions  Continue current medications  Continue to follow up with PCP  Return if worse   No orders of the defined types were placed in this encounter.   No orders of the defined types were placed in this encounter.   Return if symptoms worsen or fail to improve.    Pastor Falling, MD 10/13/2024, 1:05 PM  [facility] [address] [place], [place] [address] [phone]  "

## 2024-10-25 ENCOUNTER — Other Ambulatory Visit (HOSPITAL_BASED_OUTPATIENT_CLINIC_OR_DEPARTMENT_OTHER): Payer: Self-pay

## 2024-10-26 ENCOUNTER — Other Ambulatory Visit (HOSPITAL_BASED_OUTPATIENT_CLINIC_OR_DEPARTMENT_OTHER): Payer: Self-pay

## 2024-10-26 MED ORDER — WEGOVY 1.7 MG/0.75ML ~~LOC~~ SOAJ
1.7000 mg | SUBCUTANEOUS | 3 refills | Status: AC
  Filled 2024-10-26: qty 3, 28d supply, fill #0
  Filled 2024-11-18: qty 3, 28d supply, fill #1

## 2024-10-27 ENCOUNTER — Encounter: Payer: Self-pay | Admitting: Family Medicine

## 2024-10-27 ENCOUNTER — Other Ambulatory Visit: Payer: Self-pay | Admitting: Family Medicine

## 2024-10-27 ENCOUNTER — Other Ambulatory Visit (HOSPITAL_BASED_OUTPATIENT_CLINIC_OR_DEPARTMENT_OTHER): Payer: Self-pay

## 2024-10-27 DIAGNOSIS — F32A Depression, unspecified: Secondary | ICD-10-CM

## 2024-10-27 MED ORDER — ALPRAZOLAM 0.5 MG PO TABS: 0.5000 mg | ORAL_TABLET | Freq: Two times a day (BID) | ORAL | 0 refills | Status: AC | PRN

## 2024-10-27 NOTE — Telephone Encounter (Signed)
 Patient would like a 90D supply of her medication

## 2024-10-27 NOTE — Telephone Encounter (Signed)
 Requested Prescriptions   Pending Prescriptions Disp Refills   ALPRAZolam  (XANAX ) 0.5 MG tablet 60 tablet 0    Sig: Take 1 tablet (0.5 mg total) by mouth 2 (two) times daily as needed. for anxiety     Date of patient request: 10/28/23 Last office visit: 07/16/2024 Upcoming visit: 01/27/2025 Date of last refill: 09/25/24 Last refill amount: 60

## 2024-11-12 ENCOUNTER — Telehealth: Payer: Self-pay

## 2024-11-12 DIAGNOSIS — F32A Depression, unspecified: Secondary | ICD-10-CM

## 2024-11-12 NOTE — Telephone Encounter (Signed)
reported

## 2024-11-12 NOTE — Telephone Encounter (Signed)
 Please file this as an E2C2 error

## 2024-11-12 NOTE — Telephone Encounter (Signed)
 I called patient because she should not need a refill on her xanax  as it was filled 10/27/24. Patient said that she never called for a refill because she doesn't need one. I am not sure who Alfonso spoke to but the patient said it was not her...   Copied from CRM #[id number]. Topic: Clinical - Medication Refill >> Nov 12, 2024  2:41 PM Alfonso HERO wrote: Medication: ALPRAZolam  (XANAX ) 0.5 MG tablet  Has the patient contacted their pharmacy? Yes (Agent: If no, request that the patient contact the pharmacy for the refill. If patient does not wish to contact the pharmacy document the reason why and proceed with request.) (Agent: If yes, when and what did the pharmacy advise?)  This is the patient's preferred pharmacy:   [facility] - [place], [place] - [address] [facility] [address] [place] [address] Phone: [phone] Fax: [phone]  Is this the correct pharmacy for this prescription? Yes If no, delete pharmacy and type the correct one.   Has the prescription been filled recently? Yes  Is the patient out of the medication? Yes  Has the patient been seen for an appointment in the last year OR does the patient have an upcoming appointment? Yes  Can we respond through MyChart? Yes  Agent: Please be advised that Rx refills may take up to 3 business days. We ask that you follow-up with your pharmacy.

## 2024-11-21 ENCOUNTER — Encounter: Payer: Self-pay | Admitting: Family Medicine

## 2025-01-27 ENCOUNTER — Encounter: Admitting: Family Medicine
